# Patient Record
Sex: Female | Born: 1985
Health system: Southern US, Community
[De-identification: ages and names within clinical notes are randomized; demographics above are authoritative.]

## PROBLEM LIST (undated history)

## (undated) DIAGNOSIS — R896 Abnormal cytological findings in specimens from other organs, systems and tissues: Secondary | ICD-10-CM

## (undated) DIAGNOSIS — Z8742 Personal history of other diseases of the female genital tract: Secondary | ICD-10-CM

## (undated) DIAGNOSIS — L0501 Pilonidal cyst with abscess: Secondary | ICD-10-CM

## (undated) DIAGNOSIS — L0591 Pilonidal cyst without abscess: Secondary | ICD-10-CM

## (undated) DIAGNOSIS — E119 Type 2 diabetes mellitus without complications: Secondary | ICD-10-CM

## (undated) DIAGNOSIS — IMO0001 Reserved for inherently not codable concepts without codable children: Secondary | ICD-10-CM

## (undated) DIAGNOSIS — E282 Polycystic ovarian syndrome: Secondary | ICD-10-CM

## (undated) DIAGNOSIS — K8020 Calculus of gallbladder without cholecystitis without obstruction: Secondary | ICD-10-CM

## (undated) HISTORY — DX: Personal history of other diseases of the female genital tract: Z87.42

## (undated) HISTORY — DX: Reserved for inherently not codable concepts without codable children: IMO0001

## (undated) HISTORY — DX: Pilonidal cyst with abscess: L05.01

## (undated) HISTORY — DX: Calculus of gallbladder without cholecystitis without obstruction: K80.20

## (undated) HISTORY — DX: Abnormal cytological findings in specimens from other organs, systems and tissues: R89.6

## (undated) HISTORY — DX: Pilonidal cyst without abscess: L05.91

---
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
## 2006-03-31 ENCOUNTER — Other Ambulatory Visit: Admission: RE | Admit: 2006-03-31 | Discharge: 2006-03-31 | Payer: Self-pay | Admitting: Internal Medicine

## 2006-03-31 ENCOUNTER — Encounter: Payer: Self-pay | Admitting: Internal Medicine

## 2006-03-31 ENCOUNTER — Ambulatory Visit: Payer: Self-pay | Admitting: Internal Medicine

## 2006-03-31 ENCOUNTER — Encounter (INDEPENDENT_AMBULATORY_CARE_PROVIDER_SITE_OTHER): Payer: Self-pay | Admitting: Specialist

## 2006-03-31 LAB — CONVERTED CEMR LAB
ALT: 22 units/L (ref 0–40)
AST: 22 units/L (ref 0–37)
Albumin: 3.5 g/dL (ref 3.5–5.2)
Alkaline Phosphatase: 85 units/L (ref 39–117)
BUN: 9 mg/dL (ref 6–23)
Basophils Absolute: 0 10*3/uL (ref 0.0–0.1)
Bilirubin, Direct: 0.1 mg/dL (ref 0.0–0.3)
Calcium: 9.1 mg/dL (ref 8.4–10.5)
Chloride: 105 meq/L (ref 96–112)
Eosinophils Absolute: 0.5 10*3/uL (ref 0.0–0.6)
Eosinophils Relative: 5.5 % — ABNORMAL HIGH (ref 0.0–5.0)
GFR calc Af Amer: 118 mL/min
GFR calc non Af Amer: 97 mL/min
HDL: 57.3 mg/dL (ref >39.0)
Lymphocytes Relative: 41 % (ref 12.0–46.0)
MCHC: 33.7 g/dL (ref 30.0–36.0)
MCV: 90.5 fL (ref 78.0–100.0)
Monocytes Relative: 7.6 % (ref 3.0–11.0)
Neutro Abs: 4.1 10*3/uL (ref 1.4–7.7)
Platelets: 404 10*3/uL — ABNORMAL HIGH (ref 150–400)
RBC: 4.96 M/uL (ref 3.87–5.11)
TSH: 1.59 microintl units/mL (ref 0.35–5.50)
Total CHOL/HDL Ratio: 3.5
Triglycerides: 38 mg/dL (ref 0–149)
WBC: 8.9 10*3/uL (ref 4.5–10.5)

## 2006-05-12 ENCOUNTER — Ambulatory Visit: Payer: Self-pay | Admitting: Internal Medicine

## 2006-06-05 ENCOUNTER — Encounter: Payer: Self-pay | Admitting: Internal Medicine

## 2006-06-05 ENCOUNTER — Ambulatory Visit: Payer: Self-pay | Admitting: Internal Medicine

## 2006-06-05 DIAGNOSIS — J45909 Unspecified asthma, uncomplicated: Secondary | ICD-10-CM | POA: Insufficient documentation

## 2006-06-05 DIAGNOSIS — N926 Irregular menstruation, unspecified: Secondary | ICD-10-CM | POA: Insufficient documentation

## 2006-08-10 ENCOUNTER — Ambulatory Visit: Payer: Self-pay | Admitting: Internal Medicine

## 2006-08-10 ENCOUNTER — Telehealth: Payer: Self-pay | Admitting: Internal Medicine

## 2006-08-10 DIAGNOSIS — R079 Chest pain, unspecified: Secondary | ICD-10-CM | POA: Insufficient documentation

## 2006-10-05 ENCOUNTER — Emergency Department (HOSPITAL_COMMUNITY): Admission: EM | Admit: 2006-10-05 | Discharge: 2006-10-05 | Payer: Self-pay | Admitting: Emergency Medicine

## 2007-02-23 ENCOUNTER — Telehealth (INDEPENDENT_AMBULATORY_CARE_PROVIDER_SITE_OTHER): Payer: Self-pay | Admitting: *Deleted

## 2007-04-10 ENCOUNTER — Encounter: Payer: Self-pay | Admitting: Internal Medicine

## 2007-04-10 ENCOUNTER — Other Ambulatory Visit: Admission: RE | Admit: 2007-04-10 | Discharge: 2007-04-10 | Payer: Self-pay | Admitting: Internal Medicine

## 2007-04-10 ENCOUNTER — Ambulatory Visit: Payer: Self-pay | Admitting: Internal Medicine

## 2007-04-10 DIAGNOSIS — L639 Alopecia areata, unspecified: Secondary | ICD-10-CM

## 2007-04-10 DIAGNOSIS — G4700 Insomnia, unspecified: Secondary | ICD-10-CM | POA: Insufficient documentation

## 2007-04-10 LAB — CONVERTED CEMR LAB
ALT: 17 units/L (ref 0–35)
AST: 20 units/L (ref 0–37)
Albumin: 3.9 g/dL (ref 3.5–5.2)
BUN: 6 mg/dL (ref 6–23)
Basophils Relative: 1 % (ref 0.0–1.0)
Blood in Urine, dipstick: NEGATIVE
CO2: 31 meq/L (ref 19–32)
Calcium: 9.5 mg/dL (ref 8.4–10.5)
Chloride: 106 meq/L (ref 96–112)
Cholesterol: 184 mg/dL (ref 0–200)
Creatinine, Ser: 0.9 mg/dL (ref 0.4–1.2)
Eosinophils Relative: 4.4 % (ref 0.0–5.0)
Glucose, Bld: 96 mg/dL (ref 70–99)
Glucose, Urine, Semiquant: NEGATIVE
Hemoglobin: 14.5 g/dL (ref 12.0–15.0)
Ketones, urine, test strip: NEGATIVE
LDL Cholesterol: 117 mg/dL — ABNORMAL HIGH (ref 0–99)
Lymphocytes Relative: 37.3 % (ref 12.0–46.0)
Neutro Abs: 4.4 10*3/uL (ref 1.4–7.7)
Neutrophils Relative %: 50.6 % (ref 43.0–77.0)
Nitrite: NEGATIVE
Prolactin: 5.5 ng/mL
RBC: 4.74 M/uL (ref 3.87–5.11)
Specific Gravity, Urine: 1.015
TSH: 0.48 microintl units/mL (ref 0.35–5.50)
Total Protein: 6.7 g/dL (ref 6.0–8.3)
VLDL: 8 mg/dL (ref 0–40)
WBC: 8.8 10*3/uL (ref 4.5–10.5)
pH: 7

## 2007-04-12 ENCOUNTER — Telehealth: Payer: Self-pay | Admitting: Internal Medicine

## 2007-04-16 ENCOUNTER — Encounter: Payer: Self-pay | Admitting: Internal Medicine

## 2007-04-16 LAB — CONVERTED CEMR LAB: Vit D, 1,25-Dihydroxy: 9 — ABNORMAL LOW (ref 30–89)

## 2007-05-18 ENCOUNTER — Telehealth: Payer: Self-pay | Admitting: Internal Medicine

## 2007-06-28 ENCOUNTER — Ambulatory Visit: Payer: Self-pay | Admitting: Internal Medicine

## 2007-09-10 ENCOUNTER — Telehealth: Payer: Self-pay | Admitting: Internal Medicine

## 2008-04-29 ENCOUNTER — Telehealth: Payer: Self-pay | Admitting: Internal Medicine

## 2009-01-20 ENCOUNTER — Telehealth: Payer: Self-pay | Admitting: *Deleted

## 2009-01-24 ENCOUNTER — Emergency Department (HOSPITAL_COMMUNITY): Admission: EM | Admit: 2009-01-24 | Discharge: 2009-01-24 | Payer: Self-pay | Admitting: Family Medicine

## 2009-02-21 ENCOUNTER — Emergency Department (HOSPITAL_COMMUNITY): Admission: EM | Admit: 2009-02-21 | Discharge: 2009-02-22 | Payer: Self-pay | Admitting: Emergency Medicine

## 2009-09-07 ENCOUNTER — Ambulatory Visit: Payer: Self-pay | Admitting: Internal Medicine

## 2009-09-07 DIAGNOSIS — F4321 Adjustment disorder with depressed mood: Secondary | ICD-10-CM

## 2009-09-10 ENCOUNTER — Telehealth: Payer: Self-pay | Admitting: Internal Medicine

## 2009-09-28 ENCOUNTER — Ambulatory Visit: Payer: Self-pay | Admitting: Internal Medicine

## 2009-10-28 ENCOUNTER — Telehealth: Payer: Self-pay | Admitting: Internal Medicine

## 2009-10-28 ENCOUNTER — Ambulatory Visit: Payer: Self-pay | Admitting: Internal Medicine

## 2009-10-28 DIAGNOSIS — H6690 Otitis media, unspecified, unspecified ear: Secondary | ICD-10-CM | POA: Insufficient documentation

## 2009-10-28 DIAGNOSIS — J069 Acute upper respiratory infection, unspecified: Secondary | ICD-10-CM | POA: Insufficient documentation

## 2009-10-28 DIAGNOSIS — J45901 Unspecified asthma with (acute) exacerbation: Secondary | ICD-10-CM | POA: Insufficient documentation

## 2009-10-30 ENCOUNTER — Encounter: Payer: Self-pay | Admitting: Internal Medicine

## 2009-10-30 ENCOUNTER — Telehealth: Payer: Self-pay | Admitting: Internal Medicine

## 2009-12-17 ENCOUNTER — Emergency Department (HOSPITAL_COMMUNITY)
Admission: EM | Admit: 2009-12-17 | Discharge: 2009-12-17 | Payer: Self-pay | Source: Home / Self Care | Admitting: Family Medicine

## 2010-01-17 HISTORY — PX: CHOLECYSTECTOMY: SHX55

## 2010-02-16 NOTE — Assessment & Plan Note (Signed)
Summary: acute insomnia/dm   Vital Signs:  Patient profile:   25 year old female Menstrual status:  irregular LMP:     07/25/2009 Height:      64.25 inches Weight:      193 pounds BMI:     32.99 Pulse rate:   88 / minute BP sitting:   110 / 60  (right arm) Cuff size:   regular  Vitals Entered By: Romualdo Bolk, CMA (AAMA) (September 07, 2009 10:30 AM) CC: Discuss insomnia- Pt states that she doesn't sleep at night. Pt has been up since Noon on 8/21. Pt hasn't really slept in 2 months and on avg. gets 4-5 hours of sleep. Pt states that her boyfriend died in 16-Jul-2022 and she hasn't slept since then. LMP (date): 07/25/2009     Menstrual Status irregular Enter LMP: 07/25/2009 Last PAP Result Done   History of Present Illness: Jillian Copeland comes in today  for  above acute problem.    previous 6-8 hours of sleep without problem. ever since BF was murdered 2 months ago she hasnt been able to sleep much . denies hoeless ness or panic.or feelng unsafe.  ? what to do . tried fathers pill and no help .  increased etoh a bit to see if would help. .    Talking to counselor since his death.   x 2 .    Denies depression otherwise .  Asthma allergy /quiescent but needs refill of meds    flonase works well.     Last ov was 2009 here and no major change in health status per patient    Preventive Screening-Counseling & Management  Alcohol-Tobacco     Alcohol drinks/day: 1     Alcohol type: all     Smoking Status: current     Packs/Day: <0.25     Passive Smoke Exposure: no  Caffeine-Diet-Exercise     Caffeine use/day: 5+     Does Patient Exercise: no  Current Medications (verified): 1)  Flonase 50 Mcg/act Susp (Fluticasone Propionate) .... Spray 2 Spray Into Both Nostrils Once  A Day 2)  Proair Hfa 108 (90 Base) Mcg/act Aers (Albuterol Sulfate) .... Inhale 2 Puff As Directed Four Times A Day 3)  Qvar 80 Mcg/act Aers (Beclomethasone Dipropionate) .... Inhale 2 Puff As Directed Twice  A Day 4)  Yasmin 28 3-0.03 Mg Tabs (Drospirenone-Ethinyl Estradiol) .... Take 1 Tablet By Mouth Once A Day  Allergies (verified): No Known Drug Allergies  Past History:  Past medical, surgical, family and social histories (including risk factors) reviewed, and no changes noted (except as noted below).  Past Medical History: Reviewed history from 06/28/2007 and no changes required. Asthma 4# 7 oz BW Hx abnormal PAP ascus with neg HPV hx oligo ammenorrhea   Hx of eval in er for chest pain July    Past History:  Care Management: Counselor: Dondra Spry Chestnutt- Nestor Ramp   Family History: Reviewed history from 04/10/2007 and no changes required. mgm dm neg thyroid   Social History: Reviewed history from 06/28/2007 and no changes required.  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  going  to [employer]. fall 2011  Smoking Status:  current Packs/Day:  <0.25 Caffeine use/day:  5+ Does Patient Exercise:  no  Review of Systems       neg cv pulmonary   Gi GU.   Physical Exam  General:  Well-developed,well-nourished,in no acute distress; alert,appropriate and cooperative throughout examination  here with  24 year old family member  Head:  normocephalic and atraumatic.   Psych:  Oriented X3, memory intact for recent and remote, good eye contact, not anxious appearing, and not depressed appearing.     Impression & Recommendations:  Problem # 1:  INSOMNIA (ICD-780.52)  acute since BF murdered  no panic attacks   to decrease caffiene and avoid  alcohol.   problematic as to start school tomorrow Her updated medication list for this problem includes:    Lunesta 3 Mg Tabs (Eszopiclone) .Marland Kitchen... 1 by mouth at night if needed  for sleep  Discussed sleep hygiene.  HO x 1  and to monitor  .  Problem # 2:  GRIEF REACTION (ICD-309.0) seems rather nl  in context . counseled about htis sne expectations   Problem # 3:  ASTHMA (ICD-493.90) quiiescent and reviewed  use  of meds and will refill for now  Her updated medication list for this problem includes:    Proair Hfa 108 (90 Base) Mcg/act Aers (Albuterol sulfate) ..... Inhale 2 puff as directed four times a day    Qvar 80 Mcg/act Aers (Beclomethasone dipropionate) ..... Inhale 2 puff as directed twice a day  Complete Medication List: 1)  Flonase 50 Mcg/act Susp (Fluticasone propionate) .... Spray 2 spray into both nostrils once  a day 2)  Proair Hfa 108 (90 Base) Mcg/act Aers (Albuterol sulfate) .... Inhale 2 puff as directed four times a day 3)  Qvar 80 Mcg/act Aers (Beclomethasone dipropionate) .... Inhale 2 puff as directed twice a day 4)  Yasmin 28 3-0.03 Mg Tabs (Drospirenone-ethinyl estradiol) .... Take 1 tablet by mouth once a day 5)  Lunesta 3 Mg Tabs (Eszopiclone) .Marland Kitchen.. 1 by mouth at night if needed  for sleep  Patient Instructions: 1)  decrease caffiene     none after 6 hours pre sleep.  2)  Avoid alcohol as this can affect sleep in a negative way and cause despression.  3)  try lunesta  3 mg  samples   for 8 hours.    4)  Avoid back lighting  before bedtime . 5)  Continue with your counselor  for now   6)  rov in  3-4 weeks or so.  or as needed. Prescriptions: QVAR 80 MCG/ACT AERS (BECLOMETHASONE DIPROPIONATE) Inhale 2 puff as directed twice a day  #1 x 3   Entered and Authorized by:   Madelin Headings MD   Signed by:   Madelin Headings MD on 09/07/2009   Method used:   Electronically to        [facility] (retail)       [address]       [place], [place]  [id number]       Ph: [phone]       Fax: [phone]   RxID:   [id number] PROAIR HFA 108 (90 BASE) MCG/ACT AERS (ALBUTEROL SULFATE) Inhale 2 puff as directed four times a day  #1 x 2   Entered and Authorized by:   Madelin Headings MD   Signed by:   Madelin Headings MD on 09/07/2009   Method used:   Electronically to        [facility] (retail)       [address]        [place], [place]  [id number]       Ph: [phone]       Fax: [phone]  RxID:   [id number] FLONASE 50 MCG/ACT SUSP (FLUTICASONE PROPIONATE) Spray 2 spray into both nostrils once  a day  #1 x 12   Entered and Authorized by:   Madelin Headings MD   Signed by:   Madelin Headings MD on 09/07/2009   Method used:   Electronically to        [facility] (retail)       [address]       [place], [place]  [id number]       Ph: [phone]       Fax: [phone]   RxID:   [id number]

## 2010-02-16 NOTE — Letter (Signed)
Summary: Out of Work  Adult nurse at [employer]  [address]   [place], [place] [address]   Phone: [phone]  Fax: [phone]    October 30, 2009   Employee:  TATYANNA CRON[employer] [facility]    To Whom It May Concern:   For Medical reasons, please excuse the above named employee from work for the following dates:  Start:   10-30-09  End:    10-30-09.  This is in addition to the previous out of work note.    If you need additional information, please feel free to contact our office.         Sincerely,    Dr. Berniece Andreas

## 2010-02-16 NOTE — Progress Notes (Signed)
Summary: leakage continues & pain - work or not today 3pm?  Phone Note Call from Patient Call back at [phone]   Summary of Call: Still having clear leakage from ear.  No fever.  Ear is painful.  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   Initial call taken by: Rudy Jew, RN,  October 30, 2009 9:41 AM  Follow-up for Phone Call        no work today   if getting worse can be seen in sat clinic  Follow-up by: Madelin Headings MD,  October 30, 2009 12:47 PM  Additional Follow-up for Phone Call Additional follow up Details #1::        Per patient Fax note for today out of work to [phone] Attn Jesus.  Done.  Additional Follow-up by: Rudy Jew, RN,  October 30, 2009 1:12 PM

## 2010-02-16 NOTE — Assessment & Plan Note (Signed)
Summary: 3 WK ROV/NJR   Vital Signs:  Patient profile:   25 year old female Menstrual status:  irregular Weight:      192 pounds Pulse rate:   60 / minute BP sitting:   100 / 70  (left arm) Cuff size:   regular  Vitals Entered By: Romualdo Bolk, CMA (AAMA) (September 28, 2009 8:22 AM) CC: Follow-up visit on ambien. Pt states that it works pretty good.    History of Present Illness: Jillian Copeland comes in today  for follow up of  sleep meds.  taking  generic around 10 or 11  or so.   wakens up 4-5 am.      had bad tase in mouth on the [employer]    doing ok so far.   no panic    . try ing to "stay away form depression" . No new signs and asthma stable.     Preventive Screening-Counseling & Management  Alcohol-Tobacco     Alcohol drinks/day: 1     Alcohol type: all     Smoking Status: current     Packs/Day: <0.25     Passive Smoke Exposure: no  Caffeine-Diet-Exercise     Caffeine use/day: 5+     Does Patient Exercise: no  Current Medications (verified): 1)  Flonase 50 Mcg/act Susp (Fluticasone Propionate) .... Spray 2 Spray Into Both Nostrils Once  A Day 2)  Proair Hfa 108 (90 Base) Mcg/act Aers (Albuterol Sulfate) .... Inhale 2 Puff As Directed Four Times A Day 3)  Qvar 80 Mcg/act Aers (Beclomethasone Dipropionate) .... Inhale 2 Puff As Directed Twice A Day 4)  Yasmin 28 3-0.03 Mg Tabs (Drospirenone-Ethinyl Estradiol) .... Take 1 Tablet By Mouth Once A Day 5)  Ambien 10 Mg Tabs (Zolpidem Tartrate) .Marland Kitchen.. 1 By Mouth Hs As Needed For Sleep  Allergies (verified): No Known Drug Allergies  Past History:  Past medical, surgical, family and social histories (including risk factors) reviewed, and no changes noted (except as noted below).  Past Medical History: Reviewed history from 06/28/2007 and no changes required. Asthma 4# 7 oz BW Hx abnormal PAP ascus with neg HPV hx oligo ammenorrhea   Hx of eval in er for chest pain July    Past History:  Care  Management: Counselor: Dondra Spry Chestnutt- Nestor Ramp   Family History: Reviewed history from 04/10/2007 and no changes required. mgm dm neg thyroid   Social History: Reviewed history from 09/07/2009 and no changes required.  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  going  to [employer]. fall 2011  Review of Systems       no change   Physical Exam  General:  alert, well-developed, well-nourished, and well-hydrated.   Psych:  Oriented X3, normally interactive, good eye contact, not anxious appearing, and not depressed appearing.  less stressed and more relaxed appearing   Impression & Recommendations:  Problem # 1:  INSOMNIA (ICD-780.52) helped by sleep aid.   Discussed risk benefit  again.  using med 2-3 x per week  .  pt aware.  The following medications were removed from the medication list:    Lunesta 3 Mg Tabs (Eszopiclone) .Marland Kitchen... 1 by mouth at night if needed  for sleep Her updated medication list for this problem includes:    Ambien 10 Mg Tabs (Zolpidem tartrate) .Marland Kitchen... 1 by mouth hs as needed for sleep  Problem # 2:  GRIEF REACTION (ICD-309.0) coping   Problem #  3:  ASTHMA (ICD-493.90) stable flu shot today Her updated medication list for this problem includes:    Proair Hfa 108 (90 Base) Mcg/act Aers (Albuterol sulfate) ..... Inhale 2 puff as directed four times a day    Qvar 80 Mcg/act Aers (Beclomethasone dipropionate) ..... Inhale 2 puff as directed twice a day  Complete Medication List: 1)  Flonase 50 Mcg/act Susp (Fluticasone propionate) .... Spray 2 spray into both nostrils once  a day 2)  Proair Hfa 108 (90 Base) Mcg/act Aers (Albuterol sulfate) .... Inhale 2 puff as directed four times a day 3)  Qvar 80 Mcg/act Aers (Beclomethasone dipropionate) .... Inhale 2 puff as directed twice a day 4)  Yasmin 28 3-0.03 Mg Tabs (Drospirenone-ethinyl estradiol) .... Take 1 tablet by mouth once a day 5)  Ambien 10 Mg Tabs (Zolpidem tartrate) .Marland Kitchen.. 1 by  mouth hs as needed for sleep  Other Orders: Admin 1st Vaccine (40347) Flu Vaccine 74yrs + (42595)  Patient Instructions: 1)  continue sleep hygiene 2)  med as needed. 3)  return office visit  months or as needed. Prescriptions: AMBIEN 10 MG TABS (ZOLPIDEM TARTRATE) 1 by mouth hs as needed for sleep  #30 x 0   Entered and Authorized by:   Madelin Headings MD   Signed by:   Madelin Headings MD on 09/28/2009   Method used:   Print then Give to Patient   RxID:   [id number]        Flu Vaccine Consent Questions     Do you have a history of severe allergic reactions to this vaccine? no    Any prior history of allergic reactions to egg and/or gelatin? no    Do you have a sensitivity to the preservative Thimersol? no    Do you have a past history of Guillan-Barre Syndrome? no    Do you currently have an acute febrile illness? no    Have you ever had a severe reaction to latex? no    Vaccine information given and explained to patient? yes    Are you currently pregnant? no    Lot Number:[id number]   Exp Date:07/17/2010   Site Given  Left Deltoid IMu Romualdo Bolk, CMA (AAMA)  September 28, 2009 8:25 AM

## 2010-02-16 NOTE — Progress Notes (Signed)
Summary: please return call  Phone Note Call from Patient Call back at [phone]   Caller: Patient-live call Reason for Call: Talk to Nurse Summary of Call: Would like for [facility] to return call. No further message was given. Initial call taken by: Warnell Forester,  January 20, 2009 12:55 PM  Follow-up for Phone Call        LM for pt call back. Follow-up by: Romualdo Bolk, CMA Duncan Dull),  January 20, 2009 5:11 PM  Additional Follow-up for Phone Call Additional follow up Details #1::        LMTOCB Additional Follow-up by: Romualdo Bolk, CMA Duncan Dull),  January 23, 2009 11:32 AM    Additional Follow-up for Phone Call Additional follow up Details #2::    LMTOCB Romualdo Bolk, CMA (AAMA)  January 28, 2009 10:07 AM Pt never returned call. Follow-up by: Romualdo Bolk, CMA (AAMA),  February 02, 2009 8:55 AM

## 2010-02-16 NOTE — Assessment & Plan Note (Signed)
Summary: coughing and sob/ssc   Vital Signs:  Patient profile:   25 year old female Menstrual status:  irregular LMP:     10/26/2009 Weight:      191 pounds O2 Sat:      98 % on Room air Temp:     98.4 degrees F oral Pulse rate:   85 / minute BP sitting:   110 / 80  (right arm) Cuff size:   regular  Vitals Entered By: Romualdo Bolk, CMA (AAMA) (October 28, 2009 4:05 PM)  O2 Flow:  Room air CC: Coughing, congestion, sob and wheezing. No fever this started on 10/10. Rt ear runny with clear fluid. LMP (date): 10/26/2009     Enter LMP: 10/26/2009 Last PAP Result Done   History of Present Illness: Jillian Copeland  comes in today as an acute  visit for above  Onsetr with 2 days of sore throat and then cough.  and now wheezing.    today used Inhaler today x 2  ? no help . using and otc.   ? name  some sneezing.   now ear pain x one day better after clear drainaing  no drops in ear .  some face pressure    chest feels tight. ? no fever but didnt check.  small premire infant at home  avoiding   Preventive Screening-Counseling & Management  Alcohol-Tobacco     Alcohol drinks/day: 1     Alcohol type: all     Smoking Status: current     Packs/Day: <0.25     Passive Smoke Exposure: no  Caffeine-Diet-Exercise     Caffeine use/day: 5+     Does Patient Exercise: no  Current Medications (verified): 1)  Flonase 50 Mcg/act Susp (Fluticasone Propionate) .... Spray 2 Spray Into Both Nostrils Once  A Day 2)  Proair Hfa 108 (90 Base) Mcg/act Aers (Albuterol Sulfate) .... Inhale 2 Puff As Directed Four Times A Day 3)  Qvar 80 Mcg/act Aers (Beclomethasone Dipropionate) .... Inhale 2 Puff As Directed Twice A Day 4)  Yasmin 28 3-0.03 Mg Tabs (Drospirenone-Ethinyl Estradiol) .... Take 1 Tablet By Mouth Once A Day 5)  Ambien 10 Mg Tabs (Zolpidem Tartrate) .Marland Kitchen.. 1 By Mouth Hs As Needed For Sleep  Allergies (verified): No Known Drug Allergies  Past History:  Past medical, surgical,  family and social histories (including risk factors) reviewed for relevance to current acute and chronic problems.  Past Medical History: Reviewed history from 06/28/2007 and no changes required. Asthma 4# 7 oz BW Hx abnormal PAP ascus with neg HPV hx oligo ammenorrhea   Hx of eval in er for chest pain July    Past History:  Care Management: Counselor: Dondra Spry Chestnutt- Nestor Ramp   Family History: Reviewed history from 04/10/2007 and no changes required. mgm dm neg thyroid   Social History: Reviewed history from 09/07/2009 and no changes required. 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 fall 2011  Review of Systems  The patient denies anorexia, fever, weight loss, vision loss, chest pain, syncope, peripheral edema, hemoptysis, abdominal pain, severe indigestion/heartburn, hematuria, transient blindness, difficulty walking, abnormal bleeding, enlarged lymph nodes, and angioedema.    Physical Exam  General:  alert, well-developed, and well-nourished.  congested in nad  not breathless  good color  Head:  normocephalic and atraumatic.   Eyes:  vision grossly intact, pupils equal, and pupils round.  Ears:  L ear normal.  right ear red and  dusky light reflex ron right  mild eac tenderness no discharge seen  and no perf seen   Nose:  no external deformity and no external erythema.  congested right more than left  Mouth:  pharynx pink and moist.   no lesions Neck:  No deformities, masses, or tenderness noted. Lungs:  some decrease air movement   no rales normal respiratory effort, no intercostal retractions, no accessory muscle use, and no wheezes.   Heart:  normal rate and regular rhythm.     Impression & Recommendations:  Problem # 1:  OTITIS MEDIA, ACUTE (ICD-382.9) right    complicated  uri .could be self limiting but with asthma will empirically rx with antibiotic  and cover for sinustis dosing  Her updated  medication list for this problem includes:    Amoxicillin 500 Mg Tabs (Amoxicillin) .Marland Kitchen... 1 by mouth three times a day for 7 days  Problem # 2:  URI (ICD-465.9) prob viral  some localization  Problem # 3:  ASTHMA, WITH ACUTE EXACERBATION (ICD-493.92)  mild flare     from above   at risk. Her updated medication list for this problem includes:    Proair Hfa 108 (90 Base) Mcg/act Aers (Albuterol sulfate) ..... Inhale 2 puff as directed four times a day    Qvar 80 Mcg/act Aers (Beclomethasone dipropionate) ..... Inhale 2 puff as directed twice a day  Pulmonary Functions Reviewed: O2 sat: 98 (10/28/2009)  Orders: Nebulizer Tx ([address])  Complete Medication List: 1)  Flonase 50 Mcg/act Susp (Fluticasone propionate) .... Spray 2 spray into both nostrils once  a day 2)  Proair Hfa 108 (90 Base) Mcg/act Aers (Albuterol sulfate) .... Inhale 2 puff as directed four times a day 3)  Qvar 80 Mcg/act Aers (Beclomethasone dipropionate) .... Inhale 2 puff as directed twice a day 4)  Yasmin 28 3-0.03 Mg Tabs (Drospirenone-ethinyl estradiol) .... Take 1 tablet by mouth once a day 5)  Ambien 10 Mg Tabs (Zolpidem tartrate) .Marland Kitchen.. 1 by mouth hs as needed for sleep 6)  Amoxicillin 500 Mg Tabs (Amoxicillin) .Marland Kitchen.. 1 by mouth three times a day for 7 days  Patient Instructions: 1)  take antibiotic for ear infection and possible sinus infection 2)  restart the q var to 2 puffs two times a day  because of the wheezing 3)  Use the pro air up to every 6 hours as needed. 4)  if not improving before the weekend then call or if worse . 5)  consider proenisone if wheezing not controllled. 6)  congestion may last a week or so otherwise.  Prescriptions: AMOXICILLIN 500 MG TABS (AMOXICILLIN) 1 by mouth three times a day for 7 days  #21 x 0   Entered and Authorized by:   Madelin Headings MD   Signed by:   Madelin Headings MD on 10/28/2009   Method used:   Electronically to        [facility] (retail)        [address]       [place], [place]  [id number]       Ph: [phone]       Fax: [phone]   RxID:   [id number]

## 2010-02-16 NOTE — Progress Notes (Signed)
Summary: ov today-please triage  Phone Note Call from Patient Call back at [phone]   Caller: Patient Call For: Madelin Headings MD Summary of Call: pt has cough requesting ov today Initial call taken by: Heron Sabins,  October 28, 2009 11:35 AM  Follow-up for Phone Call        Spoke to pt and she states that she has a cough, clear liquid is coming out of rt ear, Pt has wheezing and sob. Per Dr. Fabian Sharp- okay to work in today. Pt is coming in now. Follow-up by: Romualdo Bolk, CMA Duncan Dull),  October 28, 2009 2:57 PM

## 2010-02-16 NOTE — Progress Notes (Signed)
Summary: questions about meds  Phone Note Call from Patient   Caller: Patient Call For: Madelin Headings MD Summary of Call: Pt has questions about sleeping meds. [phone] Initial call taken by: Lynann Beaver CMA,  September 10, 2009 1:57 PM  Follow-up for Phone Call        [facility]. Lynann Beaver [facility]  September 10, 2009 4:42 PM  Follow-up by: Lynann Beaver CMA,  September 10, 2009 2:27 PM  Additional Follow-up for Phone Call Additional follow up Details #1::        Spoke to pt and lunesta are leaving a after taste in her mouth.  Additional Follow-up by: Romualdo Bolk, CMA (AAMA),  September 11, 2009 1:07 PM    New/Updated Medications: AMBIEN 10 MG TABS (ZOLPIDEM TARTRATE) 1 by mouth hs as needed for sleep Prescriptions: AMBIEN 10 MG TABS (ZOLPIDEM TARTRATE) 1 by mouth hs as needed for sleep  #14 x 0   Entered and Authorized by:   Madelin Headings MD   Signed by:   Madelin Headings MD on 09/11/2009   Method used:   Print then Give to Patient   RxID:   [id number]  disc with patient and mom  in person when in for  sisters appt today.     She got a few hours of sleep   but had a bad taste in mouth .   ? anythinkg else to try.    rx to try ambien but may not be any more effective .  call if not helping  .   for situation.  keep follow up appt.

## 2010-03-30 ENCOUNTER — Other Ambulatory Visit: Payer: Self-pay | Admitting: Internal Medicine

## 2010-03-31 NOTE — Telephone Encounter (Signed)
Last refill was in the fall.  Ok to refill x 1

## 2010-04-01 NOTE — Telephone Encounter (Signed)
Called in rx

## 2010-05-05 ENCOUNTER — Encounter: Payer: Self-pay | Admitting: Internal Medicine

## 2010-05-06 ENCOUNTER — Ambulatory Visit: Payer: Self-pay | Admitting: Internal Medicine

## 2010-10-07 ENCOUNTER — Ambulatory Visit: Payer: BC Managed Care – PPO | Admitting: Internal Medicine

## 2010-10-07 ENCOUNTER — Emergency Department (HOSPITAL_COMMUNITY)
Admission: EM | Admit: 2010-10-07 | Discharge: 2010-10-07 | Disposition: A | Discharge status: Home / Self Care | Payer: Managed Care, Other (non HMO) | Attending: Emergency Medicine | Admitting: Emergency Medicine

## 2010-10-07 ENCOUNTER — Emergency Department (HOSPITAL_COMMUNITY): Payer: Managed Care, Other (non HMO)

## 2010-10-07 ENCOUNTER — Telehealth: Payer: Self-pay | Admitting: *Deleted

## 2010-10-07 DIAGNOSIS — K8020 Calculus of gallbladder without cholecystitis without obstruction: Secondary | ICD-10-CM | POA: Insufficient documentation

## 2010-10-07 DIAGNOSIS — R197 Diarrhea, unspecified: Secondary | ICD-10-CM | POA: Insufficient documentation

## 2010-10-07 DIAGNOSIS — R109 Unspecified abdominal pain: Secondary | ICD-10-CM

## 2010-10-07 LAB — URINALYSIS, ROUTINE W REFLEX MICROSCOPIC
Glucose, UA: NEGATIVE mg/dL
Specific Gravity, Urine: 1.011 (ref 1.005–1.030)
Urobilinogen, UA: 1 mg/dL (ref 0.0–1.0)
pH: 8 (ref 5.0–8.0)

## 2010-10-07 LAB — DIFFERENTIAL
Basophils Absolute: 0 10*3/uL (ref 0.0–0.1)
Eosinophils Relative: 1 % (ref 0–5)
Lymphocytes Relative: 21 % (ref 12–46)
Monocytes Absolute: 1.2 10*3/uL — ABNORMAL HIGH (ref 0.1–1.0)

## 2010-10-07 LAB — URINE MICROSCOPIC-ADD ON

## 2010-10-07 LAB — CBC
HCT: 43.2 % (ref 36.0–46.0)
MCHC: 35.2 g/dL (ref 30.0–36.0)
MCV: 89.1 fL (ref 78.0–100.0)
RDW: 12.4 % (ref 11.5–15.5)

## 2010-10-07 LAB — COMPREHENSIVE METABOLIC PANEL
Albumin: 3.9 g/dL (ref 3.5–5.2)
BUN: 8 mg/dL (ref 6–23)
Creatinine, Ser: 0.87 mg/dL (ref 0.50–1.10)
Total Protein: 7.4 g/dL (ref 6.0–8.3)

## 2010-10-07 LAB — LIPASE, BLOOD: Lipase: 33 U/L (ref 11–59)

## 2010-10-07 NOTE — Telephone Encounter (Signed)
Pt was seen in the ED for stomach pains. Pt was dx with gallstones. They wanted to remove them last night but after taking percocet, pt felt fine. Pt would like a 2nd opinion for this. I advised pt instead of coming here, why don't we refer her to the surgeon and get their opinion on this because this is what we would do any way. Pt and dad okay with this. Order sent to [facility].

## 2010-10-11 ENCOUNTER — Encounter (INDEPENDENT_AMBULATORY_CARE_PROVIDER_SITE_OTHER): Payer: Self-pay | Admitting: General Surgery

## 2010-10-11 ENCOUNTER — Ambulatory Visit (INDEPENDENT_AMBULATORY_CARE_PROVIDER_SITE_OTHER): Payer: BC Managed Care – PPO | Admitting: General Surgery

## 2010-10-11 VITALS — BP 132/92 | HR 73 | Temp 97.2°F | Ht 63.5 in | Wt 195.6 lb

## 2010-10-11 DIAGNOSIS — K8020 Calculus of gallbladder without cholecystitis without obstruction: Secondary | ICD-10-CM

## 2010-10-11 HISTORY — DX: Calculus of gallbladder without cholecystitis without obstruction: K80.20

## 2010-10-11 NOTE — Patient Instructions (Signed)
Gallbladder literature given.

## 2010-10-11 NOTE — Progress Notes (Signed)
Chief Complaint  Patient presents with  . Other    eval gb with stones    HPI Jillian Copeland is a 25 y.o. female.   HPI This is a 25 year old female who has a history over the last month or 2 of having upper abdominal pain especially after she eats greasy food. This had been occurring intermittently and then she presented to the emergency room last Wednesday. This usually goes away on itself at this time it did not so she received some pain medication. She has no nausea or vomiting. She has some occasional loose stools. She has no fevers associated with this. She never had a prior history before this. He usually goes away except for the one episode required an emergency room visit. She underwent an ultrasound in the emergency room showing a small gallstone but otherwise was negative. Her liver function tests and lipase were all normal. She comes in today discuss options. She is referred by Dr. Fabian Copeland.  Past Medical History  Diagnosis Date  . Asthma   . Abnormal finding on Pap smear, ASCUS     with neg hpv  . Personal history of amenorrhea     oligo  . Chest pain     eval in er in July    History reviewed. No pertinent past surgical history.  History reviewed. No pertinent family history.  Social History History  Substance Use Topics  . Smoking status: Never Smoker   . Smokeless tobacco: Not on file  . Alcohol Use: Yes     social    No Known Allergies  Current Outpatient Prescriptions  Medication Sig Dispense Refill  . albuterol (PROAIR HFA) 108 (90 BASE) MCG/ACT inhaler Inhale 2 puffs into the lungs every 6 (six) hours as needed.        . beclomethasone (QVAR) 80 MCG/ACT inhaler Inhale 1 puff into the lungs as needed.        . drospirenone-ethinyl estradiol (YASMIN) 3-0.03 MG per tablet Take 1 tablet by mouth daily.        . fluticasone (FLONASE) 50 MCG/ACT nasal spray 2 sprays by Nasal route daily.        Marland Kitchen zolpidem (AMBIEN) 10 MG tablet take 1 tablet by mouth at  bedtime if needed for sleep  30 tablet  0    Review of Systems Review of Systems  Gastrointestinal: Positive for diarrhea.  All other systems reviewed and are negative.    Blood pressure 132/92, pulse 73, temperature 97.2 F (36.2 C), height 5' 3.5" (1.613 m), weight 195 lb 9.6 oz (88.724 kg).  Physical Exam Physical Exam  Constitutional: She appears well-developed and well-nourished.  Eyes: No scleral icterus.  Neck: Neck supple.  Cardiovascular: Normal rate, regular rhythm and normal heart sounds.   Pulmonary/Chest: Effort normal and breath sounds normal. She has no wheezes. She has no rales.  Abdominal: Soft. Bowel sounds are normal. She exhibits no distension and no mass. There is no tenderness. There is no rebound and no guarding.  Lymphadenopathy:    She has no cervical adenopathy.    Data Reviewed *RADIOLOGY REPORT*  Clinical Data: Abdominal and pelvic pain  COMPLETE ABDOMINAL ULTRASOUND  Comparison: None.  Findings:  Gallbladder: Small gallstone present. No gallbladder wall  thickening or pericholecystic fluid. Negative sonographic Murphy's  sign  Common bile duct: Measures up to 6 mm. The distal duct is  obscured by overlying bowel gas artifact.  Liver: Increased in echogenicity. No focal lesion identified.  IVC: Appears normal.  Pancreas: Inadequately visualized secondary to overlying bowel gas  artifact.  Spleen: Measures 5 cm oblique. No focal lesion.  Right Kidney: No hydronephrosis or focal lesion. Measures 9.2 cm.  Left Kidney: No hydronephrosis or focal lesion. Measures 10.8 cm.  Abdominal aorta: Not visualized proximally secondary to overlying  bowel gas artifact. Where seen, measures up to 1.6 cm  IMPRESSION:  Cholelithiasis without sonographic evidence for cholecystitis.   Assessment    Symptomatic cholelithiasis    Plan   I think that her symptoms are referable to her gallbladder although she does have a single small gallstone present. She is  pretty typical pain as well as a radiates to her back. It is associated with greasy foods. I discussed with her that I would recommend a laparoscopic cholecystectomy due to the fact that she is symptomatic as well as due to the fact that she may have further complications from the stones now she is beginning to have symptoms as well. They asked about observation and I did not recommend that although I did tell them it was their choice about whether to proceed with surgery. I discussed the risks of laparoscopic cholecystectomy to be but not limited to bleeding, infection, cystic duct leak, common bile duct injury and open procedure. They would like to think about this. I did give him some literature and they're going to call me back.        Jillian Copeland 10/11/2010, 4:57 PM

## 2010-10-14 NOTE — Consult Note (Signed)
NAMECHARO, PHILIPP NO.:  [id number]  MEDICAL RECORD NO.:  [id number]  LOCATION:  [facility]                         FACILITY:  [facility]  PHYSICIAN:  Ollen Gross. Vernell Morgans, M.D. DATE OF BIRTH:  05-31-1985  DATE OF CONSULTATION:  10/07/2010 DATE OF DISCHARGE:                                CONSULTATION   CONSULTING SURGEON:  Ollen Gross. Vernell Morgans, MD  REQUESTING PHYSICIAN:  Bethann Berkshire, MD in the Emergency Department.  PRIMARY CARE PHYSICIAN:  Neta Mends. Panosh, MD  REASON FOR CONSULTATION:  Abdominal pain with gallstones.  HISTORY OF PRESENT ILLNESS:  Jillian Copeland is a 25 year old healthy black female who developed epigastric abdominal pain last night after eating supper.  She states she had grilled chicken for supper.  She denies any nausea or vomiting but just epigastric pain.  This radiated to her back.  She says she has had no prior episodes similar to this. Due to continued pain, she was brought to the emergency department last night.  She had further workup with an ultrasound which revealed mobile, small gallstones but no evidence of cholecystitis or gallbladder wall thickening.  She did have an elevated white blood cell count of 15,000 and a mild transaminitis with an AST of 135 and a ALT of 74.  Her alkaline phosphatase, bilirubin, and lipase were all normal.  Because of these findings, we have been asked to evaluate the patient.  REVIEW OF SYSTEMS:  Please see HPI.  Otherwise all other systems have been reviewed and are negative.  FAMILY HISTORY:  Noncontributory.  PAST MEDICAL HISTORY:  Asthma.  PAST SURGICAL HISTORY:  None.  SOCIAL HISTORY:  The patient admits to smoking 10 cigarettes a day.  She is a social drinker.  Denies any illicit drug abuse.  She is currently in school for nursing and lives at home with her dad.  ALLERGIES:  NKDA.  MEDICATIONS:  None.  PHYSICAL EXAMINATION:  GENERAL:  Jillian Copeland is a pleasant 25 year old black female  who is well developed and well nourished and currently lying in bed in no acute distress. VITAL SIGNS:  Temperature 97.8, pulse 54, blood pressure 126/80, respirations 14. HEENT:  Head is normocephalic, atraumatic.  Sclerae noninjected.  Pupils equal, round, and reactive to light.  Ears and nose without any obvious masses or lesions.  No rhinorrhea.  Mouth is pink.  Throat shows no exudate. HEART:  Regular rate and rhythm.  Normal S1, S2.  No murmurs, gallops, or rubs are noted.  She does have palpable carotid, radial, and pedal pulses bilaterally. LUNGS:  Clear to auscultation bilaterally with no wheezes, rhonchi, or rales noted.  Respiratory effort was nonlabored. ABDOMEN:  Soft and nontender, nondistended with active bowel sounds. She does not have any peritonitis, masses, hernias, or Murphy sign. MUSCULOSKELETAL:  All 4 extremities are symmetrical.  No cyanosis, clubbing, or edema. SKIN:  Warm and dry without masses, lesions, or rashes. PSYCH:  The patient is alert and oriented x3 with appropriate affect.  LABS AND DIAGNOSTICS:  White blood cell count of 15,300, hemoglobin 15.2, hematocrit 43.2, platelet count is 360,000.  Sodium 140, potassium 4.1, glucose 107, BUN 8, creatinine 0.87, total bilirubin 0.5, alkaline phosphatase 95, AST 135,  ALT 74, lipase is 33.  Diagnostic ultrasound of the abdomen reveals cholelithiasis without evidence of sonographic cholecystitis.  IMPRESSION:  Biliary colic.  PLAN:  Currently the patient is pain free.  She was given 1 Percocet around 0420 a.m. this morning but is no longer having any symptoms.  I have discussed the findings with the patient as well as her father who is present.  Given the fact that the patient is now asymptomatic, I do believe that she can go home and follow up with [place] as an outpatient for an elective cholecystectomy.  We will have the emergency department give the patient clear liquids prior to discharge to make sure she  can tolerate these.  If she does tolerate these, she may be discharged home and she is instructed on a low-fat diet.  If for some reason, she is unable to tolerate clear liquids and her pain returned, I have discussed with the emergency room physician assistant to call me back and we would look at an admission.     Jillian Cape, PA   ______________________________ Ollen Gross. Vernell Morgans, M.D.    KEO/MEDQ  D:  10/07/2010  T:  10/07/2010  Job:  [id number]  cc:   [facility] Neta Mends. Fabian Sharp, MD Bethann Berkshire, MD  Electronically Signed by Barnetta Chapel PA on 10/11/2010 01:27:48 PM Electronically Signed by Chevis Pretty III M.D. on 10/14/2010 09:49:42 AM

## 2010-10-18 ENCOUNTER — Ambulatory Visit (INDEPENDENT_AMBULATORY_CARE_PROVIDER_SITE_OTHER): Payer: BC Managed Care – PPO | Admitting: General Surgery

## 2010-10-19 ENCOUNTER — Telehealth (INDEPENDENT_AMBULATORY_CARE_PROVIDER_SITE_OTHER): Payer: Self-pay

## 2010-10-19 NOTE — Telephone Encounter (Signed)
Returned pt's message from this am b/c she was calling about having pain at the level of a 10. The pt does not have any nausea,vomiting, or fevers. The pt did take one Vicodin this am. The pt is scheduled for surgery next wk by Dr Dwain Sarna and I advised pt if she continues to get worse she will need to go to the ER. / [facility]

## 2010-10-22 ENCOUNTER — Other Ambulatory Visit (INDEPENDENT_AMBULATORY_CARE_PROVIDER_SITE_OTHER): Payer: Self-pay | Admitting: General Surgery

## 2010-10-22 ENCOUNTER — Emergency Department (HOSPITAL_COMMUNITY): Payer: Managed Care, Other (non HMO)

## 2010-10-22 ENCOUNTER — Encounter (HOSPITAL_COMMUNITY): Payer: Managed Care, Other (non HMO)

## 2010-10-22 ENCOUNTER — Ambulatory Visit (HOSPITAL_COMMUNITY)
Admission: RE | Admit: 2010-10-22 | Discharge: 2010-10-22 | Disposition: A | Discharge status: Home / Self Care | Payer: Managed Care, Other (non HMO) | Source: Ambulatory Visit | Attending: General Surgery | Admitting: General Surgery

## 2010-10-22 ENCOUNTER — Telehealth (INDEPENDENT_AMBULATORY_CARE_PROVIDER_SITE_OTHER): Payer: Self-pay

## 2010-10-22 ENCOUNTER — Observation Stay (HOSPITAL_COMMUNITY)
Admission: EM | Admit: 2010-10-22 | Discharge: 2010-10-24 | DRG: 446 | Disposition: A | Discharge status: Home / Self Care | Payer: Managed Care, Other (non HMO) | Attending: General Surgery | Admitting: General Surgery

## 2010-10-22 DIAGNOSIS — K8010 Calculus of gallbladder with chronic cholecystitis without obstruction: Principal | ICD-10-CM | POA: Diagnosis present

## 2010-10-22 DIAGNOSIS — Z23 Encounter for immunization: Secondary | ICD-10-CM | POA: Insufficient documentation

## 2010-10-22 DIAGNOSIS — Z01810 Encounter for preprocedural cardiovascular examination: Secondary | ICD-10-CM | POA: Insufficient documentation

## 2010-10-22 DIAGNOSIS — Z01812 Encounter for preprocedural laboratory examination: Secondary | ICD-10-CM | POA: Insufficient documentation

## 2010-10-22 DIAGNOSIS — Z01818 Encounter for other preprocedural examination: Secondary | ICD-10-CM | POA: Insufficient documentation

## 2010-10-22 DIAGNOSIS — K8020 Calculus of gallbladder without cholecystitis without obstruction: Secondary | ICD-10-CM

## 2010-10-22 DIAGNOSIS — F17200 Nicotine dependence, unspecified, uncomplicated: Secondary | ICD-10-CM | POA: Insufficient documentation

## 2010-10-22 DIAGNOSIS — J45909 Unspecified asthma, uncomplicated: Secondary | ICD-10-CM | POA: Insufficient documentation

## 2010-10-22 LAB — COMPREHENSIVE METABOLIC PANEL
ALT: 38 U/L — ABNORMAL HIGH (ref 0–35)
ALT: 39 U/L — ABNORMAL HIGH (ref 0–35)
Albumin: 3.5 g/dL (ref 3.5–5.2)
Albumin: 3.9 g/dL (ref 3.5–5.2)
Alkaline Phosphatase: 93 U/L (ref 39–117)
Alkaline Phosphatase: 94 U/L (ref 39–117)
BUN: 10 mg/dL (ref 6–23)
BUN: 11 mg/dL (ref 6–23)
Chloride: 105 mEq/L (ref 96–112)
Chloride: 103 mEq/L (ref 96–112)
Glucose, Bld: 105 mg/dL — ABNORMAL HIGH (ref 70–99)
Glucose, Bld: 88 mg/dL (ref 70–99)
Potassium: 3.4 mEq/L — ABNORMAL LOW (ref 3.5–5.1)
Potassium: 4 mEq/L (ref 3.5–5.1)
Sodium: 138 mEq/L (ref 135–145)
Total Bilirubin: 0.1 mg/dL — ABNORMAL LOW (ref 0.3–1.2)
Total Bilirubin: 0.2 mg/dL — ABNORMAL LOW (ref 0.3–1.2)
Total Protein: 7.6 g/dL (ref 6.0–8.3)

## 2010-10-22 LAB — CBC
HCT: 41.3 % (ref 36.0–46.0)
HCT: 42.9 % (ref 36.0–46.0)
Hemoglobin: 14.1 g/dL (ref 12.0–15.0)
MCV: 90.9 fL (ref 78.0–100.0)
Platelets: 379 10*3/uL (ref 150–400)
RBC: 4.72 MIL/uL (ref 3.87–5.11)
WBC: 8.2 10*3/uL (ref 4.0–10.5)
WBC: 10.3 10*3/uL (ref 4.0–10.5)

## 2010-10-22 LAB — DIFFERENTIAL
Basophils Absolute: 0.1 10*3/uL (ref 0.0–0.1)
Basophils Absolute: 0.1 10*3/uL (ref 0.0–0.1)
Eosinophils Relative: 5 % (ref 0–5)
Lymphocytes Relative: 47 % — ABNORMAL HIGH (ref 12–46)
Lymphocytes Relative: 50 % — ABNORMAL HIGH (ref 12–46)
Monocytes Absolute: 0.6 10*3/uL (ref 0.1–1.0)
Monocytes Absolute: 0.6 10*3/uL (ref 0.1–1.0)
Monocytes Relative: 7 % (ref 3–12)
Monocytes Relative: 6 % (ref 3–12)
Neutro Abs: 3.3 10*3/uL (ref 1.7–7.7)

## 2010-10-22 LAB — SURGICAL PCR SCREEN: MRSA, PCR: NEGATIVE

## 2010-10-22 LAB — LIPASE, BLOOD: Lipase: 26 U/L (ref 11–59)

## 2010-10-22 NOTE — Telephone Encounter (Signed)
Pt's father called today to report that his daughter was in a great deal of pain and probably could not make it until her scheduled surgery date of 10/27/10.  I advised him to take her to the [facility] ED, and if she needed emergency gallbladder surgery they would contact our surgeon on call.  Pt's father will call us on Monday to let us know how Derisha is doing.

## 2010-10-23 ENCOUNTER — Other Ambulatory Visit (INDEPENDENT_AMBULATORY_CARE_PROVIDER_SITE_OTHER): Payer: Self-pay | Admitting: General Surgery

## 2010-10-23 ENCOUNTER — Inpatient Hospital Stay (HOSPITAL_COMMUNITY): Payer: Managed Care, Other (non HMO)

## 2010-10-23 DIAGNOSIS — K8010 Calculus of gallbladder with chronic cholecystitis without obstruction: Secondary | ICD-10-CM

## 2010-10-27 ENCOUNTER — Ambulatory Visit (HOSPITAL_COMMUNITY): Admission: RE | Admit: 2010-10-27 | Payer: BC Managed Care – PPO | Source: Ambulatory Visit | Admitting: General Surgery

## 2010-10-29 NOTE — Op Note (Signed)
NAMEHADLEI, STITT NO.:  [id number]  MEDICAL RECORD NO.:  [id number]  LOCATION:  1514                         FACILITY:  [facility]  PHYSICIAN:  Lodema Pilot, MD       DATE OF BIRTH:  [birth year]  DATE OF PROCEDURE:  10/23/2010 DATE OF DISCHARGE:                              OPERATIVE REPORT   PROCEDURE:  Laparoscopic cholecystectomy with intraoperative cholangiogram.  PREOPERATIVE DIAGNOSIS:  Symptomatic cholelithiasis.  POSTOPERATIVE DIAGNOSIS:  Symptomatic cholelithiasis.  SURGEON:  Dr. Lodema Pilot.  ASSISTANT:  Dr. Sandria Bales. Newman.  ANESTHESIA:  General endotracheal anesthesia with 26 mL of 1% lidocaine with epinephrine, 0.25% Marcaine in 50:50 mixture.  FLUIDS:  150 mL of crystalloid.  ESTIMATED BLOOD LOSS:  Minimal.  DRAINS:  None.  SPECIMENS:  Gallbladder and contents sent to Pathology for permanent sectioning.  FINDINGS:  Normal-appearing gallbladder, very few tiny stones in the gallbladder, normal intraoperative cholangiogram.  COMPLICATIONS:  None apparent.  INDICATION OF PROCEDURE:  Ms. Diebold is a 25 year old female who has had a 41-month history of intermittent epigastric and bilateral upper abdominal discomfort, mainly after eating and worse after eating fatty foods.  She had been seen in the ER multiple times for evaluation and she was scheduled for elective cholecystectomy with Dr. Dwain Sarna, and presented to the emergency room again yesterday with continued symptoms. I discussed with her the risks of the procedure and her somewhat atypical symptoms, and explained that her largest risk would be risk of that her symptoms persist after surgery and that her diarrhea may be increased.  We also discussed the standard risks of injury to bile or bile ducts or bleeding and infection, and she expressed understanding and desired to proceed.  OPERATIVE DETAILS:  She was seen and evaluated in the preop area.  Risks and benefits of the  procedure were discussed in lay terms.  Informed consent was obtained and prophylactic antibiotics were given.  She was taken to the operating room, placed on the table in supine position and general endotracheal anesthesia was obtained.  Abdomen was prepped and draped in a standard surgical fashion.  A supraumbilical midline incision was made in the skin and dissection carried down to the abdominal wall fascia using blunt dissection.  Fascia was sharply incised and elevated between Kocher clamps, and peritoneum was elevated and sharply incised, and a 12-mm balloon trocar was placed at the umbilicus and pneumoperitoneum was obtained.  Her abdomen was inspected and there was no evidence of bowel injury upon entry, and an 11 mm epigastric trocar and two 5-mm right upper quadrant trocars were placed under direct visualization, and the gallbladder was retracted cephalad. There was some omental adhesions which were taken down using blunt dissection and her anatomy appeared routine and the gallbladder was not inflamed.  The cystic duct was easily visualized and the cystic artery was coursing in its normal position on the medial portion of the gallbladder.  The artery was skeletonized and seen identified coursing up onto the gallbladder and was divided between Hemoclips.  This allows to open up the triangle of Calot and obtain a critical view of safety visualizing liver parenchyma through the triangle of Calot.  Cystic duct was skeletonized and clip  was placed on the gallbladder side and a small cystic ductotomy was made, and cholangiogram catheter was placed at the abdominal wall through the 14-gauge Angiocatheter and a cholangiogram was performed which demonstrated normal cystic duct, with right and left hepatic ducts and filling of the common bile duct with no evidence of filling defects, and there was free flow of bile into the duodenum.  A cholangiogram catheter was removed and the cystic  duct was clipped between Hemoclips, two on the cystic side and one on the distal side, and the cystic duct was transected and the gallbladder was removed from the gallbladder fossa using Bovie electrocautery.  There was some spillage of bile from the gallbladder where I had clipped the cystic duct on the gallbladder.  Some of the gallbladder had slipped out of the clip and there was some leakage of bile, but no leakage of stones.  The gallbladder was completely removed from the gallbladder fossa using Bovie electrocautery.  It was removed from the abdomen through the umbilical incision in an EndoCatch bag and inspected on the back table after the case, and was noted to have very few tiny gallstones and a single cystic duct.  It was sent to Pathology for permanent sectioning. The gallbladder fossa was inspected for hemostasis which was obtained with Bovie electrocautery.  No evidence of bleeding or bile.  Clips appeared to be in good position and the right upper quadrant was irrigated with sterile saline solution until the irrigation returned clear.  Clips and the gallbladder fossa were again inspected and noted to be adequate, and the right upper quadrant trocars were removed under direct visualization.  The umbilical trocar was removed and the abdominal fascia was closed with interrupted 0 Vicryl sutures, and prior to securing these sutures, the abdomen was reinsufflated to the epigastric trocar and abdominal closure was noted be adequate without any evidence of bowel injury.  Right upper quadrant again appeared hemostatic without evidence of bowel injury and the epigastric trocar was removed.  Skin was injected with total of 26 mL of 1% lidocaine with epinephrine, 0.25% Marcaine in 50:50 mixture.  The skin edges were approximated with 4-0 Monocryl, subcuticular suture, and the skin was washed and dried and Dermabond applied.  All sponge, needle, and instrument counts were correct at the  end of the case.  The patient tolerated the procedure well without apparent complications.          ______________________________ Lodema Pilot, MD     BL/MEDQ  D:  10/23/2010  T:  10/23/2010  Job:  [id number]  Electronically Signed by Lodema Pilot DO on 10/29/2010 12:14:38 AM

## 2010-10-29 NOTE — H&P (Signed)
NAMEHADASA, GASNER NO.:  [id number]  MEDICAL RECORD NO.:  [id number]  LOCATION:  1514                         FACILITY:  [facility]  PHYSICIAN:  Lodema Pilot, MD       DATE OF BIRTH:  [birth year]  DATE OF ADMISSION:  10/22/2010 DATE OF DISCHARGE:                             HISTORY & PHYSICAL   CHIEF COMPLAINT:  Abdominal pain.  HISTORY OF PRESENT ILLNESS:  Jillian Copeland is a 25 year old female with a 1 month history of upper abdominal pain after eating.  She has had no symptoms prior to approximately 1 month ago and has been having bilateral upper abdominal and epigastric discomfort after eating over that time.  She has been in the emergency room before and has been evaluated by Dr. Dwain Sarna for evaluation of this and was already scheduled for cholecystectomy next week.  She states that her symptoms usually go away on their own, but they have been worse over the last week.  She has no associated nausea or vomiting and no fevers or chills. She has already had an ultrasound of her abdomen which demonstrates cholelithiasis, but no evidence of acute cholecystitis.  As I stated her symptoms have been increased this week.  She woke up Tuesday with discomfort.  She took some of the pain medications that she had been prescribed and her symptoms resolved.  Then she had return of her symptoms this afternoon at 1 o'clock where she had bilateral upper abdominal discomfort, which she describes as a "knots" in her abdomen. She denies any radiation of her pain and on presentation in the emergency room, she described this as a 10/10 pain, although her symptoms are down to 5/10 pain now.  She also has some associated diarrhea, but no blood in the stools or black stools.  ALLERGIES:  None.  MEDICATIONS:  She is on albuterol, beclomethasone inhaler, Yasmin, Flonase, and Ambien.  PAST MEDICAL HISTORY:  Significant for asthma.  She had abnormal Pap smear and abdominal pain.   Denies any past surgical history.  SOCIAL HISTORY:  She smokes approximately 10 cigarettes per day and drinks socially.  REVIEW OF SYSTEMS:  She has some diarrhea and abdominal pain, but otherwise noncontributory.  PHYSICAL EXAMINATION:  GENERAL:  She is in no acute distress and nontoxic appearing, resting comfortably in the bed. VITAL SIGNS:  Stable and she is afebrile. HEENT:  Her head is normocephalic and atraumatic.  Sclerae are white and extraocular muscles intact.  Her trachea is midline and she has no stridor. LUNGS:  Clear to auscultation bilaterally. HEART:  Rate is normal with regular rhythm. ABDOMEN:  Soft.  She has minimal upper abdominal tenderness mainly in the epigastric region.  She has no Murphy's sign and she is nondistended and no peritonitis. EXTREMITIES:  Show normal strength and range of motion in all four extremities and pulses are palpable in all four extremities. SKIN:  Normal.  Warm and dry and good skin turgor.  No jaundice.  LABORATORY STUDIES:  Demonstrate white blood cell count of 10.3, hemoglobin of 14.4, hematocrit of 42.9, platelets of 379, 39% neutrophils.  Sodium is 138, potassium is 4.0, chloride is 103, CO2 of 29, BUN of 11, creatinine is 0.78,  glucose 88.  Bilirubin is 0.2, alk phos is 94, AST is 23, ALT is 39, lipase is 26.  RADIOGRAPHIC STUDIES:  Demonstrate small gallstones within the gallbladder, but no wall thickening or pericholecystic fluid.  Common bile duct is normal, and otherwise normal abdominal ultrasound.  ASSESSMENT:  Cholelithiasis and abdominal pain.  I do think that many of her symptoms could be attributed to her cholelithiasis and I have offered her cholecystectomy.  She does however have diarrhea and some left upper abdominal tenderness as well and she may have other source of these symptoms as well.  I explained to her my concern of the risk that cholecystectomy may not relieve all of her symptoms that she may  still have, left upper quadrant pain, and that her diarrhea may actually increase after removal of her gallbladder.  She expressed understanding of this and her father was present for this discussion as well.  We also discussed the other risks of this surgery including infection, bleeding, pain, scarring, need for open surgery, injury to bowel or bile ducts and again the possibility of persistent symptoms.  She expressed understanding and desires to proceed with cholecystectomy.  Since she has had fewer episodes which have required ER visit, I have offered to keep her in the hospital tonight and to perform her cholecystectomy as soon as possible or she can keep her already scheduled appointment with Dr. Dwain Sarna on Wednesday.  She states that she has is having repeated pain and would like to proceed with cholecystectomy as soon as possible.  PLAN: 1. To admit her to the hospital. 2. Keep her n.p.o. 3. A plan for cholecystectomy when available.          ______________________________ Lodema Pilot, MD     BL/MEDQ  D:  10/22/2010  T:  10/23/2010  Job:  [id number]  Electronically Signed by Lodema Pilot DO on 10/29/2010 12:14:33 AM

## 2010-11-25 ENCOUNTER — Encounter (INDEPENDENT_AMBULATORY_CARE_PROVIDER_SITE_OTHER): Payer: Self-pay | Admitting: General Surgery

## 2010-11-25 ENCOUNTER — Ambulatory Visit (INDEPENDENT_AMBULATORY_CARE_PROVIDER_SITE_OTHER): Payer: BC Managed Care – PPO | Admitting: General Surgery

## 2010-11-25 VITALS — BP 130/82 | HR 70 | Temp 96.9°F | Resp 16 | Ht 63.5 in | Wt 192.0 lb

## 2010-11-25 DIAGNOSIS — Z5189 Encounter for other specified aftercare: Secondary | ICD-10-CM

## 2010-11-25 DIAGNOSIS — Z4889 Encounter for other specified surgical aftercare: Secondary | ICD-10-CM

## 2010-11-25 NOTE — Progress Notes (Signed)
Subjective:     Patient ID: Jillian Copeland, female   DOB: 09-03-1985, 25 y.o.   MRN: [id number]  HPI Patient follows up approximately one month status post a cholecystectomy for abdominal pain. She states that she has been doing well and denies any of her preoperative pain. She is off pain medications and has returned to work and the gym. She'd her bowels are functioning and has actually improved although she still has some immediate bowel movements after eating fatty foods. Her pathology was benign.  Review of Systems     Objective:   Physical Exam No acute distress and nontoxic-appearing  Her abdomen is soft nontender and exam her incisions are healing well without sign of infection    Assessment:     Status post arthroscopic cholecystectomy-doing well it appears that her symptoms that she was having preoperatively had resolved. She still has some bowel irregularities but this is not abnormal for her. I suspect that some of this will continued to improve with time. But overall she seems to be doing much better than preoperatively.    Plan:     Return to activity as tolerated and follow up on a p.r.n. basis

## 2011-01-18 HISTORY — PX: PILONIDAL CYST DRAINAGE: SHX743

## 2011-03-22 ENCOUNTER — Emergency Department (INDEPENDENT_AMBULATORY_CARE_PROVIDER_SITE_OTHER)
Admission: EM | Admit: 2011-03-22 | Discharge: 2011-03-22 | Disposition: A | Discharge status: Home / Self Care | Payer: Managed Care, Other (non HMO) | Source: Home / Self Care | Attending: Emergency Medicine | Admitting: Emergency Medicine

## 2011-03-22 ENCOUNTER — Encounter (HOSPITAL_COMMUNITY): Payer: Self-pay | Admitting: Emergency Medicine

## 2011-03-22 DIAGNOSIS — L0591 Pilonidal cyst without abscess: Secondary | ICD-10-CM

## 2011-03-22 MED ORDER — BACITRACIN 500 UNIT/GM EX OINT: 1.0000 "application " | TOPICAL_OINTMENT | Freq: Once | CUTANEOUS | Status: DC

## 2011-03-22 MED ORDER — HYDROCODONE-ACETAMINOPHEN 5-325 MG PO TABS: 2.0000 | ORAL_TABLET | ORAL | Status: AC | PRN

## 2011-03-22 MED ORDER — SULFAMETHOXAZOLE-TRIMETHOPRIM 800-160 MG PO TABS: 1.0000 | ORAL_TABLET | Freq: Two times a day (BID) | ORAL | Status: AC

## 2011-03-22 MED ORDER — LIDOCAINE-EPINEPHRINE 2 %-1:100000 IJ SOLN: 5.0000 mL | Freq: Once | INTRAMUSCULAR | Status: DC

## 2011-03-22 MED ORDER — IBUPROFEN 600 MG PO TABS: 600.0000 mg | ORAL_TABLET | Freq: Four times a day (QID) | ORAL | Status: AC | PRN

## 2011-03-22 NOTE — Discharge Instructions (Signed)
Pilonidal Cyst Care After A pilonidal cyst occurs when hairs get trapped (ingrown) beneath the skin in the crease between the buttocks over your sacrum (the bone under that crease). Pilonidal cysts are most common in young men with a lot of body hair. When the cyst breaks(ruptured) or leaks, fluid from the cyst may cause burning and itching. If the cyst becomes infected, it causes a painful swelling filled with pus (abscess). The pus and trapped hairs need to be removed (often by lancing) so that the infection can heal. The word pilonidal means hair nest. HOME CARE INSTRUCTIONS If the pilonidal sinus was NOT DRAINING OR LANCED:  Keep the area clean and dry. Bathe or shower daily. Wash the area well with a germ-killing soap. Hot tub baths may help prevent infection. Dry the area well with a towel.   Avoid tight clothing in order to keep area as moisture-free as possible.   Keep area between buttocks as free from hair as possible. A depilatory may be used.   Take antibiotics as directed.   Only take over-the-counter or prescription medicines for pain, discomfort, or fever as directed by your caregiver.  If the cyst WAS INFECTED AND NEEDED TO BE DRAINED:  Your caregiver may have packed the wound with gauze to keep the wound open. This allows the wound to heal from the inside outward and continue to drain.   Return as directed for a wound check.   If you take tub baths or showers, repack the wound with gauze as directed following. Sponge baths are a good alternative. Sitz baths may be used three to four times a day or as directed.   If an antibiotic was ordered to fight the infection, take as directed.   Only take over-the-counter or prescription medicines for pain, discomfort, or fever as directed by your caregiver.   If a drain was in place and removed, use sitz baths for 20 minutes 4 times per day. Clean the wound gently with mild unscented soap, pat dry, and then apply a dry dressing as  directed.  If you had surgery and IT WAS MARSUPIALIZED (LEFT OPEN):  Your wound was packed with gauze to keep the wound open. This allows the wound to heal from the inside outwards and continue draining. The changing of the dressing regularly also helps keep the wound clean.   Return as directed for a wound check.   If you take tub baths or showers, repack the wound with gauze as directed following. Sponge baths are a good alternative. Sitz baths can also be used. This may be done three to four times a day or as directed.   If an antibiotic was ordered to fight the infection, take as directed.   Only take over-the-counter or prescription medicines for pain, discomfort, or fever as directed by your caregiver.   If you had surgery and the wound was closed you may care for it as directed. This generally includes keeping it dry and clean and dressing it as directed.  SEEK MEDICAL CARE IF:   You have increased pain, swelling, redness, drainage, or bleeding from the area.   You have a fever.   You have muscles aches, dizziness, or a general ill feeling.  Document Released: 02/03/2006 Document Revised: 12/23/2010 Document Reviewed: 04/20/2006 [employer] Patient Information 2012 [employer]. 

## 2011-03-22 NOTE — ED Provider Notes (Signed)
History     CSN: [id number]  Arrival date & time 03/22/11  1644   First MD Initiated Contact with Patient 03/22/11 1705      Chief Complaint  Patient presents with  . Abscess    (Consider location/radiation/quality/duration/timing/severity/associated sxs/prior treatment) HPI Comments: Patient reports a painful, erythematous mass at her right gluteal cleft starting a week ago. States she has had intermittent pain and swelling here before, but it has never become this severe or that lasted this long. No trauma to the area. No nausea, vomiting, fevers. No expressible drainage. Pain worse with palpation. No alleviating factors. Patient has not tried anything for this. No history of diabetes.  ROS as noted in HPI. All other ROS negative.   Patient is a 26 y.o. female presenting with abscess. The history is provided by the patient. No language interpreter was used.  Abscess  This is a recurrent problem. The current episode started more than one week ago. The onset was gradual. The problem has been gradually worsening. The abscess is present on the right buttock. The abscess is characterized by redness, painfulness and swelling. It is unknown what she was exposed to. Pertinent negatives include no fever.    Past Medical History  Diagnosis Date  . Asthma   . Abnormal finding on Pap smear, ASCUS     with neg hpv  . Personal history of amenorrhea     oligo  . Chest pain     eval in er in July    Past Surgical History  Procedure Date  . Cholecystectomy 2012    History reviewed. No pertinent family history.  History  Substance Use Topics  . Smoking status: Current Everyday Smoker  . Smokeless tobacco: Not on file  . Alcohol Use: Yes     social    OB History    Grav Para Term Preterm Abortions TAB SAB Ect Mult Living                  Review of Systems  Constitutional: Negative for fever.    Allergies  Review of patient's allergies indicates no known allergies.  Home  Medications   Current Outpatient Rx  Name Route Sig Dispense Refill  . ALBUTEROL SULFATE HFA 108 (90 BASE) MCG/ACT IN AERS Inhalation Inhale 2 puffs into the lungs every 6 (six) hours as needed.      . BECLOMETHASONE DIPROPIONATE 80 MCG/ACT IN AERS Inhalation Inhale 1 puff into the lungs as needed.      Marland Kitchen FLUTICASONE PROPIONATE 50 MCG/ACT NA SUSP Nasal 2 sprays by Nasal route daily.      Marland Kitchen HYDROCODONE-ACETAMINOPHEN 5-325 MG PO TABS Oral Take 2 tablets by mouth every 4 (four) hours as needed for pain. 20 tablet 0  . IBUPROFEN 600 MG PO TABS Oral Take 1 tablet (600 mg total) by mouth every 6 (six) hours as needed for pain. 30 tablet 0  . SULFAMETHOXAZOLE-TRIMETHOPRIM 800-160 MG PO TABS Oral Take 1 tablet by mouth 2 (two) times daily. 20 tablet 0    BP 115/75  Pulse 80  Temp(Src) 98.3 F (36.8 C) (Oral)  Resp 18  SpO2 100%  LMP 09/22/2010  Physical Exam  Nursing note and vitals reviewed. Constitutional: She is oriented to person, place, and time. She appears well-developed and well-nourished. No distress.  HENT:  Head: Normocephalic and atraumatic.  Eyes: Conjunctivae and EOM are normal.  Neck: Normal range of motion.  Cardiovascular: Normal rate.   Pulmonary/Chest: Effort normal.  Abdominal: She  exhibits no distension.  Musculoskeletal: Normal range of motion.  Neurological: She is alert and oriented to person, place, and time.  Skin: Skin is warm and dry.       5 x 3 cm area of tenderness, redness, induration by right gluteal cleft. Expressible drainage. Skin intact. Small amount of central fluctuance.  Psychiatric: She has a normal mood and affect. Her behavior is normal. Judgment and thought content normal.    ED Course  INCISION AND DRAINAGE Date/Time: 03/22/2011 11:00 PM Performed by: Luiz Blare Authorized by: Luiz Blare Consent: Verbal consent obtained. Risks and benefits: risks, benefits and alternatives were discussed Consent given by:  patient Patient understanding: patient states understanding of the procedure being performed Patient consent: the patient's understanding of the procedure matches consent given Site marked: the operative site was marked Required items: required blood products, implants, devices, and special equipment available Patient identity confirmed: verbally with patient Time out: Immediately prior to procedure a "time out" was called to verify the correct patient, procedure, equipment, support staff and site/side marked as required. Type: pilonidal cyst Body area: anogenital Location details: gluteal cleft Anesthesia: local infiltration Local anesthetic: lidocaine 2% with epinephrine Anesthetic total: 2 ml Patient sedated: no Scalpel size: 11 Incision type: single straight Drainage: bloody Drainage amount: scant Wound treatment: wound left open Packing material: none Patient tolerance: Patient tolerated the procedure well with no immediate complications. Comments: Marked area of induration with a surgical marker. Applied bacitracin and sterile dressing.   (including critical care time)  Labs Reviewed - No data to display No results found.   1. Infected pilonidal cyst       MDM  5x3 infected pilonidal cyst r gluteal cleft.  Small amt fluctuance, attempted I&D with no drainage. Will start her on Bactrim, NSAIDs, Norco, warm compresses,  followup with surgery in several days.  Luiz Blare, MD 03/22/11 [phone]

## 2011-03-22 NOTE — ED Notes (Signed)
Pt. Stated, I have a boil on my back at the beginning of my butt for a week, very sore it hurts so bad.

## 2011-03-23 ENCOUNTER — Encounter: Payer: Self-pay | Admitting: Internal Medicine

## 2011-03-23 ENCOUNTER — Encounter (INDEPENDENT_AMBULATORY_CARE_PROVIDER_SITE_OTHER): Payer: Managed Care, Other (non HMO) | Admitting: Surgery

## 2011-03-23 ENCOUNTER — Ambulatory Visit (INDEPENDENT_AMBULATORY_CARE_PROVIDER_SITE_OTHER): Payer: Managed Care, Other (non HMO) | Admitting: Internal Medicine

## 2011-03-23 VITALS — BP 112/76 | HR 86 | Temp 98.3°F | Wt 198.0 lb

## 2011-03-23 DIAGNOSIS — T887XXA Unspecified adverse effect of drug or medicament, initial encounter: Secondary | ICD-10-CM

## 2011-03-23 DIAGNOSIS — L0501 Pilonidal cyst with abscess: Secondary | ICD-10-CM

## 2011-03-23 MED ORDER — CEPHALEXIN 500 MG PO CAPS: 500.0000 mg | ORAL_CAPSULE | Freq: Three times a day (TID) | ORAL | Status: AC

## 2011-03-23 NOTE — Patient Instructions (Signed)
Keep surgery appt.   Tomorrow  Can change antibiotic   To keflex in the mean time.  Warm compresses and showers may help  Also.

## 2011-03-23 NOTE — Progress Notes (Signed)
  Subjective:    Patient ID: Jillian Copeland, female    DOB: 1985-02-18, 26 y.o.   MRN: [id number]  HPI Patient comes in today for SDA for  new problem evaluation.  Has had pain and swelling in the buttock area for days . Went to the urgent care last pm and had drainage attempt  but no pus came out put on septra tmp.  had nausea.   Asks for different . Med  And opinion about going to surgeon.   No fever no hx of I and D  Has had hx of pain off and on for a while . Pain may be? Worse  Hurts to sit.   Review of Systems Neg cp sob.   Hx of same   No rash no vomiting   Past history family history social history reviewed in the electronic medical record.     Objective:   Physical Exam WDWN in nad  Buttocks area  large swollen area left of center and cnetral pitting  Minimal redness but tender.   Small 1-2 mm entry point noted  Just lateral to midline.   And closed   Gait mildly antalgic  Has Ho from urgent care  Reviewed     Assessment & Plan:  Pilonidal cyst abscess   Options discussed  Agree with surgical I& d  apparently unsuccessful in the urgent care at that time.  Pt declined 4 pm appt but will be seen tomorrow after noon.Marland Kitchen   Antibiotic changed.    Expectant management. And fu explained .

## 2011-03-24 ENCOUNTER — Ambulatory Visit (INDEPENDENT_AMBULATORY_CARE_PROVIDER_SITE_OTHER): Payer: Managed Care, Other (non HMO) | Admitting: Surgery

## 2011-03-24 ENCOUNTER — Encounter (INDEPENDENT_AMBULATORY_CARE_PROVIDER_SITE_OTHER): Payer: Self-pay | Admitting: Surgery

## 2011-03-24 DIAGNOSIS — L0591 Pilonidal cyst without abscess: Secondary | ICD-10-CM

## 2011-03-24 DIAGNOSIS — L0501 Pilonidal cyst with abscess: Secondary | ICD-10-CM

## 2011-03-24 HISTORY — DX: Pilonidal cyst with abscess: L05.01

## 2011-03-24 NOTE — Progress Notes (Signed)
[facility]  Ovidio Kin, MD,  FACS [address] [place]  [address] [place], [place]    [address] Phone:  [phone] FAX:  [phone]   Re:   Jillian Copeland DOB:   [birth year] MRN:   [id number]  Urgent Office  ASSESSMENT AND PLAN: 1.  Pilonidal abscess  I&D in office 03/24/2010.  To do sitz baths 3x/day.  Has Kelfex as antibiotic.  Vicodin for pain.  See me back in 7 to 10 days.  2.  Smoking, knows it is bad for her health.  HISTORY OF PRESENT ILLNESS: Chief Complaint  Patient presents with  . Pain    Jillian Copeland is a 26 y.o. (DOB: 03-31-1985)  AA female who is a patient of Lorretta Harp, MD, MD and comes to me today for pilonidal cyst.  The patient has no prior history of pilonidal infection. She noticed pain between her buttocks last Tuesday, 15 March 2011. Because of worsening pain she went to the [facility] urgent care on Tuesday, 22 March 2011. They tried to do an incision and drainage of this infection. But were not very successful. She went to see Dr. Berniece Andreas yesterday and Dr. Berniece Andreas referred her to our office today.  She was first given Septra as antibiotic, but could not tolerate that. She was switched to Keflex as antibiotics.  ROS:  Smokes cigarettes and knows it is bad for her health. Had lap chole by Dr. Trude Mcburney - 10/23/2010.  Social:  She is not married and has no children. 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   PHYSICAL EXAM: BP 117/79  Pulse 85  Temp(Src) 98 F (36.7 C) (Temporal)  Resp 16  Ht 5' 3.5" (1.613 m)  Wt 195 lb (88.451 kg)  BMI 34.00 kg/m2  LMP 09/22/2010  Buttocks:  Inflammation and swelling in the intergluteal cleft c/w a pilonidal abcess.  Procedure:  Buttocks painted with betadine.  Infiltrated with 1% xylocaine.  Drained about 30 cc pus from abscess and packed the wound with gauze.  DATA REVIEWED: Dr. Rosezella [place] note.   Ovidio Kin, MD, FACS Office:   [phone]

## 2011-03-24 NOTE — Patient Instructions (Signed)
1.  Remove packing tomorrow AM.  2.  Do sitz baths 3 times per day.  3.  May use gauze or sanitary pad to cover wound. Do not put antibiotics or cream in wound.

## 2011-04-01 ENCOUNTER — Encounter (INDEPENDENT_AMBULATORY_CARE_PROVIDER_SITE_OTHER): Payer: Managed Care, Other (non HMO) | Admitting: Surgery

## 2011-04-13 ENCOUNTER — Encounter (INDEPENDENT_AMBULATORY_CARE_PROVIDER_SITE_OTHER): Payer: Self-pay | Admitting: Surgery

## 2011-05-31 ENCOUNTER — Encounter: Payer: Self-pay | Admitting: Internal Medicine

## 2011-05-31 ENCOUNTER — Ambulatory Visit (INDEPENDENT_AMBULATORY_CARE_PROVIDER_SITE_OTHER): Payer: Managed Care, Other (non HMO) | Admitting: Internal Medicine

## 2011-05-31 VITALS — BP 120/80 | HR 90 | Temp 97.8°F | Wt 193.0 lb

## 2011-05-31 DIAGNOSIS — J45909 Unspecified asthma, uncomplicated: Secondary | ICD-10-CM

## 2011-05-31 DIAGNOSIS — J309 Allergic rhinitis, unspecified: Secondary | ICD-10-CM | POA: Insufficient documentation

## 2011-05-31 DIAGNOSIS — J3489 Other specified disorders of nose and nasal sinuses: Secondary | ICD-10-CM

## 2011-05-31 DIAGNOSIS — R0981 Nasal congestion: Secondary | ICD-10-CM

## 2011-05-31 MED ORDER — PREDNISONE 20 MG PO TABS: ORAL_TABLET | ORAL | Status: AC

## 2011-05-31 NOTE — Progress Notes (Signed)
  Subjective:    Patient ID: Jillian Copeland, female    DOB: 05/01/1985, 26 y.o.   MRN: [id number]  HPI Patient comes in today for SDA for  new problem evaluation. Onset 3 days ago with all of the above sx. Cough st congestion without face pain . Has tried  Mucinex.   Allergy med.  No seeming to help. continuing and no one else sick at home Is now seeing allergist  on allergy shots .  And given astepro/nasonex and another allergy pill ? name Cough tight  Not wheezing at this time. ? What to do next. Review of Systems Neg cp sob fever ha face pain nvd rash   Past history family history social history reviewed in the electronic medical record. Outpatient Prescriptions Prior to Visit  Medication Sig Dispense Refill  . albuterol (PROAIR HFA) 108 (90 BASE) MCG/ACT inhaler Inhale 2 puffs into the lungs every 6 (six) hours as needed.        . beclomethasone (QVAR) 80 MCG/ACT inhaler Inhale 1 puff into the lungs as needed.        . fluticasone (FLONASE) 50 MCG/ACT nasal spray 2 sprays by Nasal route daily.             Objective:   Physical Exam BP 120/80  Pulse 90  Temp(Src) 97.8 F (36.6 C) (Oral)  Wt 193 lb (87.544 kg)  SpO2 99% Very congested wdwn in nad WDWN in NAD  quiet respirations; congested  somewhat hoarse. Non toxic . HEENT: Normocephalic ;atraumatic , Eyes;  PERRL, EOMs  Full, lids and conjunctiva clear,,Ears: no deformities, canals nl, TM landmarks normal, Nose: no deformity or discharge but congested;face minimally tender Mouth : OP clear without lesion or edema . Neck: Supple without adenopathy or masses or bruits Chest:  Clear to A&P without wheezes rales or rhonchi  ? Dec bs  CV:  S1-S2 no gallops or murmurs peripheral perfusion is normal Skin :nl perfusion and no acute rashes      Assessment & Plan:  Acute ur congestion viral vs allergic   Asthma on allergy shots  Add decongestant and pred if continuing and  Cont with allergy meds,  Call with med name    Inhaler as needed

## 2011-05-31 NOTE — Patient Instructions (Signed)
It is possible that the reason your allergy medicine isn't working as well is because you have a viral respiratory infection like a head cold.  This will get better on its own in 7-10 days. However because of her severe congestion as possible your allergy is adding to this.  Add decongestant such as pseudoephedrine or phenylephrine and take this over the next 24-48 hours. Mucinex D. is one of these preparations If her nasal congestion is not improving or your asthma begins to flare then  take the 5 days of prednisone. Use your inhaler as needed.     Upper Respiratory Infection, Adult An upper respiratory infection (URI) is also sometimes known as the common cold. The upper respiratory tract includes the nose, sinuses, throat, trachea, and bronchi. Bronchi are the airways leading to the lungs. Most people improve within 1 week, but symptoms can last up to 2 weeks. A residual cough may last even longer.  CAUSES Many different viruses can infect the tissues lining the upper respiratory tract. The tissues become irritated and inflamed and often become very moist. Mucus production is also common. A cold is contagious. You can easily spread the virus to others by oral contact. This includes kissing, sharing a glass, coughing, or sneezing. Touching your mouth or nose and then touching a surface, which is then touched by another person, can also spread the virus. SYMPTOMS  Symptoms typically develop 1 to 3 days after you come in contact with a cold virus. Symptoms vary from person to person. They may include:  Runny nose.   Sneezing.   Nasal congestion.   Sinus irritation.   Sore throat.   Loss of voice (laryngitis).   Cough.   Fatigue.   Muscle aches.   Loss of appetite.   Headache.   Low-grade fever.  DIAGNOSIS  You might diagnose your own cold based on familiar symptoms, since most people get a cold 2 to 3 times a year. Your caregiver can confirm this based on your exam. Most  importantly, your caregiver can check that your symptoms are not due to another disease such as strep throat, sinusitis, pneumonia, asthma, or epiglottitis. Blood tests, throat tests, and X-rays are not necessary to diagnose a common cold, but they may sometimes be helpful in excluding other more serious diseases. Your caregiver will decide if any further tests are required. RISKS AND COMPLICATIONS  You may be at risk for a more severe case of the common cold if you smoke cigarettes, have chronic heart disease (such as heart failure) or lung disease (such as asthma), or if you have a weakened immune system. The very young and very old are also at risk for more serious infections. Bacterial sinusitis, middle ear infections, and bacterial pneumonia can complicate the common cold. The common cold can worsen asthma and chronic obstructive pulmonary disease (COPD). Sometimes, these complications can require emergency medical care and may be life-threatening. PREVENTION  The best way to protect against getting a cold is to practice good hygiene. Avoid oral or hand contact with people with cold symptoms. Wash your hands often if contact occurs. There is no clear evidence that vitamin C, vitamin E, echinacea, or exercise reduces the chance of developing a cold. However, it is always recommended to get plenty of rest and practice good nutrition. TREATMENT  Treatment is directed at relieving symptoms. There is no cure. Antibiotics are not effective, because the infection is caused by a virus, not by bacteria. Treatment may include:  Increased fluid  intake. Sports drinks offer valuable electrolytes, sugars, and fluids.   Breathing heated mist or steam (vaporizer or shower).   Eating chicken soup or other clear broths, and maintaining good nutrition.   Getting plenty of rest.   Using gargles or lozenges for comfort.   Controlling fevers with ibuprofen or acetaminophen as directed by your caregiver.    Increasing usage of your inhaler if you have asthma.  Zinc gel and zinc lozenges, taken in the first 24 hours of the common cold, can shorten the duration and lessen the severity of symptoms. Pain medicines may help with fever, muscle aches, and throat pain. A variety of non-prescription medicines are available to treat congestion and runny nose. Your caregiver can make recommendations and may suggest nasal or lung inhalers for other symptoms.  HOME CARE INSTRUCTIONS   Only take over-the-counter or prescription medicines for pain, discomfort, or fever as directed by your caregiver.   Use a warm mist humidifier or inhale steam from a shower to increase air moisture. This may keep secretions moist and make it easier to breathe.   Drink enough water and fluids to keep your urine clear or pale yellow.   Rest as needed.   Return to work when your temperature has returned to normal or as your caregiver advises. You may need to stay home longer to avoid infecting others. You can also use a face mask and careful hand washing to prevent spread of the virus.  SEEK MEDICAL CARE IF:   After the first few days, you feel you are getting worse rather than better.   You need your caregiver's advice about medicines to control symptoms.   You develop chills, worsening shortness of breath, or brown or red sputum. These may be signs of pneumonia.   You develop yellow or brown nasal discharge or pain in the face, especially when you bend forward. These may be signs of sinusitis.   You develop a fever, swollen neck glands, pain with swallowing, or white areas in the back of your throat. These may be signs of strep throat.  SEEK IMMEDIATE MEDICAL CARE IF:   You have a fever.   You develop severe or persistent headache, ear pain, sinus pain, or chest pain.   You develop wheezing, a prolonged cough, cough up blood, or have a change in your usual mucus (if you have chronic lung disease).   You develop sore  muscles or a stiff neck.  Document Released: 06/29/2000 Document Revised: 12/23/2010 Document Reviewed: 05/07/2010 [facility] Patient Information 2012 [place], [place].

## 2011-06-30 ENCOUNTER — Institutional Professional Consult (permissible substitution): Payer: Managed Care, Other (non HMO) | Admitting: Internal Medicine

## 2011-07-25 ENCOUNTER — Ambulatory Visit (INDEPENDENT_AMBULATORY_CARE_PROVIDER_SITE_OTHER): Payer: Managed Care, Other (non HMO) | Admitting: General Surgery

## 2011-07-25 ENCOUNTER — Encounter (INDEPENDENT_AMBULATORY_CARE_PROVIDER_SITE_OTHER): Payer: Self-pay | Admitting: General Surgery

## 2011-07-25 VITALS — BP 124/78 | HR 68 | Temp 97.4°F | Resp 14 | Ht 63.5 in | Wt 189.1 lb

## 2011-07-25 DIAGNOSIS — L0501 Pilonidal cyst with abscess: Secondary | ICD-10-CM

## 2011-07-25 MED ORDER — CEPHALEXIN 500 MG PO CAPS: 500.0000 mg | ORAL_CAPSULE | Freq: Two times a day (BID) | ORAL | Status: AC

## 2011-07-25 MED ORDER — OXYCODONE-ACETAMINOPHEN 5-325 MG PO TABS: 1.0000 | ORAL_TABLET | ORAL | Status: DC | PRN

## 2011-07-25 NOTE — Progress Notes (Signed)
Subjective:     Patient ID: Jillian Copeland, female   DOB: 1985-08-15, 26 y.o.   MRN: [id number]  HPI 84 yof seen by Dr. Ezzard Standing in past for pilonidal abscess.  She over past several days has had recurrence of a mass in top of her gluteal cleft associated with pain.  This has started draining purulent fluid today.  She denies fevers.   Review of Systems     Objective:   Physical Exam At top of gluteal cleft she has tender mass right greater than left with induration, there is a 1x1 cm hole in the middle of the cleft already present draining purulent fluid, this opened further when I examiner her, No real erythema, she is not hirsute, I probed this area and feel it is adequately drained and packed it.    Assessment:     Likely pilonidal abscess    Plan:     It is in correct position for pilonidal disease although no real hair around it.  I packed this and think this is well drained. She will leave her packing in and will see Dr. Ezzard Standing Wednesday.

## 2011-07-27 ENCOUNTER — Ambulatory Visit (INDEPENDENT_AMBULATORY_CARE_PROVIDER_SITE_OTHER): Payer: Managed Care, Other (non HMO) | Admitting: Surgery

## 2011-07-27 ENCOUNTER — Encounter (INDEPENDENT_AMBULATORY_CARE_PROVIDER_SITE_OTHER): Payer: Self-pay | Admitting: Surgery

## 2011-07-27 VITALS — BP 126/90 | HR 102 | Temp 97.3°F | Ht 63.0 in | Wt 190.4 lb

## 2011-07-27 DIAGNOSIS — L0501 Pilonidal cyst with abscess: Secondary | ICD-10-CM

## 2011-07-27 NOTE — Progress Notes (Addendum)
[facility]  Ovidio Kin, MD,  FACS [address] [place]  [address] [place], [place]    [address] Phone:  [phone] FAX:  [phone]   Re:   Jillian Copeland DOB:   October 10, 1985 MRN:   [id number]  ASSESSMENT AND PLAN: 1.  Pilonidal abscess  I did an I&D in office 03/24/2011.  She failed follow up with me.  She saw Dr. Dwain Sarna on 07/25/2011.  He then sent her back to me for a recurrent pilonidal.  She needs to to sitz baths TID.  I am going to give this 3 to 6 weeks to heal, then consider resecting the area.  She was again given literature on pilonidal cysts/infections.  2.  Smoking, knows it is bad for her health.  HISTORY OF PRESENT ILLNESS: Chief Complaint  Patient presents with  . Follow-up    reck pil cyst    Jillian Copeland is a 26 y.o. (DOB: 1985/11/19)  AA female who is a patient of Lorretta Harp, MD and comes to me today for pilonidal cyst.  I saw her originally in March 2013, but she failed follow up.  The pilonidal cyst came back on 07/21/2011.  She then saw Dr. Dwain Sarna on Monday, 07/25/2011, and he sent her back to me today.  ROS:  Smokes cigarettes and knows it is bad for her health. Had lap chole by Dr. Trude Mcburney - 10/23/2010.  Social:  She is not married and has no children.  Lives with parents. 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  She works 3rd shift.  PHYSICAL EXAM: BP 126/90  Pulse 102  Temp 97.3 F (36.3 C) (Temporal)  Ht 5\' 3"  (1.6 m)  Wt 190 lb 6.4 oz (86.365 kg)  BMI 33.73 kg/m2  SpO2 98%  Buttocks:  She has a 1.5 cm hole draining a larger area (about 3 to 4 cm) that is somewhat left sided.  I removed packing in the wound.  It is reasonably clean.  DATA REVIEWED: Dr. Doreen Salvage notes.  Ovidio Kin, MD, FACS Office:  [phone]

## 2011-08-31 ENCOUNTER — Encounter (INDEPENDENT_AMBULATORY_CARE_PROVIDER_SITE_OTHER): Payer: Self-pay | Admitting: Surgery

## 2011-08-31 ENCOUNTER — Ambulatory Visit (INDEPENDENT_AMBULATORY_CARE_PROVIDER_SITE_OTHER): Payer: Managed Care, Other (non HMO) | Admitting: Surgery

## 2011-08-31 VITALS — BP 126/82 | HR 96 | Temp 97.2°F | Resp 16 | Ht 63.5 in | Wt 193.2 lb

## 2011-08-31 DIAGNOSIS — L0501 Pilonidal cyst with abscess: Secondary | ICD-10-CM

## 2011-08-31 NOTE — Progress Notes (Signed)
[facility]  Ovidio Kin, MD,  FACS [address] [place]  [address] [place], [place]    [address] Phone:  [phone] FAX:  [phone]   Re:   Jillian Copeland DOB:   1985-06-18 MRN:   [id number]  ASSESSMENT AND PLAN: 1.  Pilonidal abscess  I did an I&D in office 03/24/2011.  She failed follow up with me.  She saw Dr. Dwain Sarna on 07/25/2011.  He then sent her back to me for a recurrent pilonidal.  She had another abscess today and I had do another I&D today - 03/03/2011  I will see her back every 1 to 2 weeks until we get her scheduled for excision.  2.  Smoking, knows it is bad for her health.  HISTORY OF PRESENT ILLNESS: Chief Complaint  Patient presents with  . Follow-up    pil cyst    Jillian Copeland is a 26 y.o. (DOB: April 27, 1985)  AA female who is a patient of Lorretta Harp, MD and comes to me today for pilonidal cyst.  I saw her originally in March 2013, but she failed follow up.  The pilonidal cyst came back on 07/21/2011.  She then saw Dr. Dwain Sarna on Monday, 07/25/2011.   She come back with another abscess today.  ROS:  Smokes cigarettes and knows it is bad for her health. Had lap chole by Dr. Trude Mcburney - 10/23/2010.  Social:  She is not married and has no children.  Lives with parents. 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  She works 3rd shift.  PHYSICAL EXAM: BP 126/82  Pulse 96  Temp 97.2 F (36.2 C) (Temporal)  Resp 16  Ht 5' 3.5" (1.613 m)  Wt 193 lb 3.2 oz (87.635 kg)  BMI 33.69 kg/m2  Buttocks:  She has another pilonidal, about 3 cm in inflammation, just to the left of midline.  Procedure:  While in the office, I painted the wound with choloroprep, infiltrated about 7 cc 1 % xylocaine, and then did a I&D of the abscess.  I made an opening about 2.5 cm and tried to cut out a piece of skin.  DATA REVIEWED: Old notes.  Ovidio Kin, MD, FACS Office:  [phone]

## 2011-09-09 ENCOUNTER — Ambulatory Visit (INDEPENDENT_AMBULATORY_CARE_PROVIDER_SITE_OTHER): Payer: Managed Care, Other (non HMO) | Admitting: Surgery

## 2011-09-09 ENCOUNTER — Encounter (INDEPENDENT_AMBULATORY_CARE_PROVIDER_SITE_OTHER): Payer: Self-pay | Admitting: Surgery

## 2011-09-09 VITALS — BP 122/82 | HR 74 | Temp 97.9°F | Resp 14 | Ht 65.0 in | Wt 191.2 lb

## 2011-09-09 DIAGNOSIS — L0501 Pilonidal cyst with abscess: Secondary | ICD-10-CM

## 2011-09-09 NOTE — Progress Notes (Signed)
[facility]  Ovidio Kin, MD,  FACS [address] [place]  [address] [place], [place]    [address] Phone:  [phone] FAX:  [phone]   Re:   Jillian Copeland DOB:   07-21-1985 MRN:   [id number]  ASSESSMENT AND PLAN: 1.  Pilonidal abscess  I did an I&D in office 03/24/2011.  She failed follow up with me.  She saw Dr. Dwain Sarna on 07/25/2011.   I did another I&D on 08/31/2011.   This time we plan to do definitive surgery when the wound is all healed.  She is doing much better.  I will see her in 2 weeks, with probable plans to schedule surgery on the next visit.   2.  Smoking, knows it is bad for her health.  We again talked about stopping smoking.  HISTORY OF PRESENT ILLNESS: Jillian Copeland is a 26 y.o. (DOB: 02-13-1985)  AA female who is a patient of Lorretta Harp, MD and comes to me today for pilonidal cyst.  I did a I&D of a recurrent pilonidal on 08/31/2011.  She is here for follow up.  She is doing much better.  Again, we talked about quitting smoking.  I reinforced how smoking delays wound healing.  ROS:  Smokes cigarettes and knows it is bad for her health. Had lap chole by Dr. Trude Mcburney - 10/23/2010.  Social:  She is not married and has no children.  Lives with parents. 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  She works 3rd shift.  PHYSICAL EXAM: BP 122/82  Pulse 74  Temp 97.9 F (36.6 C)  Resp 14  Ht 5\' 5"  (1.651 m)  Wt 191 lb 3.2 oz (86.728 kg)  BMI 31.82 kg/m2  Buttocks:  The wound is almost healed.  There is a 2 cm area that is open and superficial.  DATA REVIEWED: Old notes.  Ovidio Kin, MD, FACS Office:  [phone]

## 2011-09-21 ENCOUNTER — Encounter (INDEPENDENT_AMBULATORY_CARE_PROVIDER_SITE_OTHER): Payer: Self-pay | Admitting: Surgery

## 2011-09-21 ENCOUNTER — Ambulatory Visit (INDEPENDENT_AMBULATORY_CARE_PROVIDER_SITE_OTHER): Payer: Managed Care, Other (non HMO) | Admitting: Surgery

## 2011-09-21 VITALS — BP 122/80 | HR 80 | Temp 97.6°F | Resp 18 | Ht 64.0 in | Wt 190.4 lb

## 2011-09-21 DIAGNOSIS — L0501 Pilonidal cyst with abscess: Secondary | ICD-10-CM

## 2011-09-21 NOTE — Progress Notes (Signed)
[facility]  Ovidio Kin, MD,  FACS [address]  [address] [place], [place]    [address] Phone:  [phone] FAX:  [phone]   Re:   Jillian Copeland DOB:   1985/03/09 MRN:   [id number]  ASSESSMENT AND PLAN: 1.  Pilonidal abscess  I did an I&D in office 03/24/2011.  She failed follow up with me.  She saw Dr. Dwain Sarna on 07/25/2011.   I did another I&D on 08/31/2011.   This time we plan to do definitive surgery when the wound is all healed.  We discussed planned surgery.  The benefits and the risks.  I have given her a sheet on pilonidal cysts that outlines the surgery and its recovery.  She has a trip planned next week, so she wants to do this at the end of the month.  We talked about being out of work for 1 week after surgery.   2.  Smoking, knows it is bad for her health.  She is down to 2 cigarettes a day.  HISTORY OF PRESENT ILLNESS: Jillian Copeland is a 25 y.o. (DOB: [birth year])  AA female who is a patient of Lorretta Harp, MD and comes to me today for pilonidal cyst.  I did an I&D of a recurrent pilonidal on 08/31/2011.  The wound is healed and we will schedule her for surgery.  I gave her another sheet on pilonidal cysts.  ROS:  Smokes cigarettes and knows it is bad for her health. Had lap chole by Dr. Trude Mcburney - 10/23/2010.  Social:  She is not married and has no children.  Lives with parents. 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  She works 3rd shift.  PHYSICAL EXAM: BP 122/80  Pulse 80  Temp 97.6 F (36.4 C) (Temporal)  Resp 18  Ht 5\' 4"  (1.626 m)  Wt 190 lb 6.4 oz (86.365 kg)  BMI 32.68 kg/m2  SpO2 97%  Buttocks:  The wound is healed.  DATA REVIEWED: Old notes.  Ovidio Kin, MD, FACS Office:  [phone]

## 2011-10-21 ENCOUNTER — Other Ambulatory Visit (INDEPENDENT_AMBULATORY_CARE_PROVIDER_SITE_OTHER): Payer: Self-pay | Admitting: Surgery

## 2011-10-21 DIAGNOSIS — L0591 Pilonidal cyst without abscess: Secondary | ICD-10-CM

## 2011-11-02 ENCOUNTER — Ambulatory Visit (INDEPENDENT_AMBULATORY_CARE_PROVIDER_SITE_OTHER): Payer: Managed Care, Other (non HMO) | Admitting: Surgery

## 2011-11-02 ENCOUNTER — Encounter (INDEPENDENT_AMBULATORY_CARE_PROVIDER_SITE_OTHER): Payer: Self-pay | Admitting: Surgery

## 2011-11-02 VITALS — BP 128/72 | HR 60 | Temp 98.0°F | Resp 18 | Ht 64.0 in | Wt 190.2 lb

## 2011-11-02 DIAGNOSIS — L0501 Pilonidal cyst with abscess: Secondary | ICD-10-CM

## 2011-11-02 NOTE — Progress Notes (Signed)
[facility]  Ovidio Kin, MD,  FACS [address]  [address] [place], [place]    [address] Phone:  [phone] FAX:  [phone]   Re:   Jillian Copeland DOB:   1985-07-23 MRN:   [id number]  ASSESSMENT AND PLAN: 1.  Pilonidal abscess/cyst [facility] at [facility] - D. Ezzard Standing - 10/21/2011  This is her first follow up.   Her mother is packing it.  It looks good.  Follow up in 2 weeks.  2.  Smoking, knows it is bad for her health.  She is down to 2 cigarettes a day.  HISTORY OF PRESENT ILLNESS: Jillian Copeland is a 26 y.o. (DOB: Aug 05, 1985)  AA female who is a patient of Lorretta Harp, MD and comes to me today for follow up of pilonidal cyst excision.  She is doing well. Her mother is helping pack the wound. She is change the packing 3 times a day.  History of pilonidal cyst: I did an I&D of a pilonidal abscess in the office 03/24/2011.  She failed follow up with me.  She saw Dr. Dwain Sarna on 07/25/2011. I did another I&D of a recurrent pilonidal on 08/31/2011.    ROS:  Smokes cigarettes and knows it is bad for her health. Had lap chole by Dr. Trude Mcburney - 10/23/2010.  Social:  She is not married and has no children.   Lives with parents. 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  She works 3rd shift.  PHYSICAL EXAM: BP 128/72  Pulse 60  Temp 98 F (36.7 C) (Oral)  Resp 18  Ht 5\' 4"  (1.626 m)  Wt 190 lb 3.2 oz (86.274 kg)  BMI 32.65 kg/m2  Buttocks:  The wound is healing.  I cleaned it with peroxide.  DATA REVIEWED: Path report.  Ovidio Kin, MD, FACS Office:  [phone]

## 2011-11-17 ENCOUNTER — Ambulatory Visit (INDEPENDENT_AMBULATORY_CARE_PROVIDER_SITE_OTHER): Payer: Managed Care, Other (non HMO) | Admitting: Surgery

## 2011-11-17 ENCOUNTER — Encounter (INDEPENDENT_AMBULATORY_CARE_PROVIDER_SITE_OTHER): Payer: Self-pay | Admitting: Surgery

## 2011-11-17 VITALS — BP 118/84 | HR 71 | Temp 97.2°F | Resp 16 | Ht 64.0 in | Wt 190.6 lb

## 2011-11-17 DIAGNOSIS — L0501 Pilonidal cyst with abscess: Secondary | ICD-10-CM

## 2011-11-17 NOTE — Progress Notes (Signed)
[facility]  Ovidio Kin, MD,  FACS [address] [place]  [address] [place], [place]    [address] Phone:  [phone] FAX:  [phone]   Re:   Jillian Copeland DOB:   04-09-1985 MRN:   [id number]  ASSESSMENT AND PLAN: 1.  Pilonidal abscess/cyst excised at [facility] - D. Ebelin Dillehay - 10/21/2011   Her mother is packing it.  It looks good.  Follow up in 2 - 3 weeks.  She may go to 2 x per day sitz baths  2.  Smoking, knows it is bad for her health.  She is down to 2 cigarettes a day. 3.  She is going trick or treating with her 72 year old sister.  HISTORY OF PRESENT ILLNESS: Jillian Copeland is a 26 y.o. (DOB: 1985/08/08)  AA female who is a patient of Lorretta Harp, MD and comes to me today for follow up of pilonidal cyst excision.  She is doing well. Her mother is helping pack the wound. She is change the packing 3 times a day.  History of pilonidal cyst: I did an I&D of a pilonidal abscess in the office 03/24/2011.  She failed follow up with me.  She saw Dr. Dwain Sarna on 07/25/2011. I did another I&D of a recurrent pilonidal on 08/31/2011.    ROS:  Smokes cigarettes and knows it is bad for her health. Had lap chole by Dr. Trude Mcburney - 10/23/2010.  Social:  She is not married and has no children.   Lives with parents. 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  She works 3rd shift.  PHYSICAL EXAM: BP 118/84  Pulse 71  Temp 97.2 F (36.2 C) (Temporal)  Resp 16  Ht 5\' 4"  (1.626 m)  Wt 190 lb 9.6 oz (86.456 kg)  BMI 32.72 kg/m2  Buttocks:  The wound is healing.  I cleaned it with peroxide.  There is some overgrowth of granulation tissue, but it is not bad.  DATA REVIEWED: None new.  Ovidio Kin, MD, FACS Office:  [phone]

## 2011-12-07 ENCOUNTER — Encounter (INDEPENDENT_AMBULATORY_CARE_PROVIDER_SITE_OTHER): Payer: Managed Care, Other (non HMO) | Admitting: Surgery

## 2011-12-19 ENCOUNTER — Encounter (INDEPENDENT_AMBULATORY_CARE_PROVIDER_SITE_OTHER): Payer: Managed Care, Other (non HMO) | Admitting: Surgery

## 2012-01-13 ENCOUNTER — Ambulatory Visit (INDEPENDENT_AMBULATORY_CARE_PROVIDER_SITE_OTHER): Payer: Managed Care, Other (non HMO) | Admitting: Family Medicine

## 2012-01-13 ENCOUNTER — Ambulatory Visit (INDEPENDENT_AMBULATORY_CARE_PROVIDER_SITE_OTHER)
Admission: RE | Admit: 2012-01-13 | Discharge: 2012-01-13 | Disposition: A | Discharge status: Home / Self Care | Payer: Managed Care, Other (non HMO) | Source: Ambulatory Visit | Attending: Family Medicine | Admitting: Family Medicine

## 2012-01-13 ENCOUNTER — Encounter: Payer: Self-pay | Admitting: Family Medicine

## 2012-01-13 VITALS — BP 120/80 | HR 116 | Temp 98.0°F | Ht 64.0 in | Wt 194.0 lb

## 2012-01-13 DIAGNOSIS — J309 Allergic rhinitis, unspecified: Secondary | ICD-10-CM

## 2012-01-13 DIAGNOSIS — J45909 Unspecified asthma, uncomplicated: Secondary | ICD-10-CM

## 2012-01-13 DIAGNOSIS — R05 Cough: Secondary | ICD-10-CM

## 2012-01-13 MED ORDER — BECLOMETHASONE DIPROPIONATE 80 MCG/ACT IN AERS: 1.0000 | INHALATION_SPRAY | Freq: Two times a day (BID) | RESPIRATORY_TRACT | Status: DC

## 2012-01-13 MED ORDER — FLUTICASONE PROPIONATE 50 MCG/ACT NA SUSP: 2.0000 | Freq: Every day | NASAL | Status: DC

## 2012-01-13 MED ORDER — GUAIFENESIN-CODEINE 100-10 MG/5ML PO SYRP: 5.0000 mL | ORAL_SOLUTION | Freq: Two times a day (BID) | ORAL | Status: DC | PRN

## 2012-01-13 NOTE — Patient Instructions (Signed)
-  restart QVAR - 1 puff twice daily  -restart flonase 2 sprays each nostirl  -get chest xray - we will only call you IF this is abnormal  -continue your oral allergy medication  -use albuterol as needed  -follow up with your allergy doctor in 2-4 weeks or sooner if worsening or other concerns

## 2012-01-13 NOTE — Progress Notes (Signed)
Chief Complaint  Patient presents with  . Cough    HPI: -started: about 3-4 weeks ago -symptoms:nasal congestion, cough, wheezing, SOB - thinks has infection and asthma flare - out of qvar -last asthma flare requiring prednisone was last year -uses albuterol almost every day -not using flonase -denies:fever, SOB, NVD, tooth pain, strep or mono exposure -has tried: albuterol -sick contacts: mother with upper resp infection -Hx of: asthma -pt wants cheratussin for cough - insist DOES NOT HAVE ALLERGY TO CODEINE   ROS: See pertinent positives and negatives per HPI.  Past Medical History  Diagnosis Date  . Asthma   . Abnormal finding on Pap smear, ASCUS     with neg hpv  . Personal history of amenorrhea     oligo  . Chest pain     eval in er in July  . Pilonidal cyst     No family history on file.  History   Social History  . Marital Status: Single    Spouse Name: N/A    Number of Children: N/A  . Years of Education: N/A   Social History Main Topics  . Smoking status: Current Every Day Smoker    Types: Cigarettes  . Smokeless tobacco: Never Used     Comment: less than 10 a day   . Alcohol Use: Yes     Comment: social  . Drug Use: No  . Sexually Active: None   Other Topics Concern  . 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  , Disp: , Rfl: ;  fluticasone (FLONASE) 50 MCG/ACT nasal spray, Place 2 sprays into the nose daily., Disp: 16 g, Rfl: 0;  metFORMIN (GLUCOPHAGE) 500 MG tablet, Take 500 mg by mouth 2 (two) times daily with a meal., Disp: , Rfl:  beclomethasone (QVAR) 80 MCG/ACT inhaler, Inhale 1 puff into the lungs 2 (two) times daily., Disp: 1 Inhaler, Rfl: 0;  guaiFENesin-codeine (ROBITUSSIN AC) 100-10 MG/5ML syrup, Take 5 mLs by mouth 2  (two) times daily as needed for cough., Disp: 120 mL, Rfl: 0  EXAM:  Filed Vitals:   01/13/12 1418  BP: 120/80  Pulse: 116  Temp: 98 F (36.7 C)    Body mass index is 33.30 kg/(m^2).  GENERAL: vitals reviewed and listed above, alert, oriented, appears well hydrated and in no acute distress  HEENT: atraumatic, conjunttiva clear, no obvious abnormalities on inspection of external nose and ears, pale boggy turbinates with clear rhinorrhea  NECK: no obvious masses on inspection  LUNGS: clear to auscultation bilaterally, no wheezes, rales or rhonchi, good air movement  CV: HRRR, no peripheral edema  MS: moves all extremities without noticeable abnormality  PSYCH: pleasant and cooperative, no obvious depression or anxiety  ASSESSMENT AND PLAN:  Discussed the following assessment and plan:  1. Asthma  beclomethasone (QVAR) 80 MCG/ACT inhaler  2. Allergic rhinitis  fluticasone (FLONASE) 50 MCG/ACT nasal spray  3. Cough  DG Chest 2 View, guaiFENesin-codeine (ROBITUSSIN AC) 100-10 MG/5ML syrup   -uncontrolled allergies and asthma - no serious symptoms currently -appears well, normal pulm exam, no SOB, normal O2 sats -restart ICS and flonase -follow up with asthma doctor -cough medication provided per pt request - insists she IS NOT allergic to codeine -Patient advised to return or notify a doctor  immediately if symptoms worsen or persist or new concerns arise.  Patient Instructions  -restart QVAR - 1 puff twice daily  -restart flonase 2 sprays each nostirl  -get chest xray - we will only call you IF this is abnormal  -continue your oral allergy medication  -use albuterol as needed  -follow up with your allergy doctor in 2-4 weeks or sooner if worsening or other concerns     Niall Illes R.

## 2012-01-27 ENCOUNTER — Ambulatory Visit: Payer: Managed Care, Other (non HMO) | Admitting: Dietician

## 2012-02-01 ENCOUNTER — Telehealth (INDEPENDENT_AMBULATORY_CARE_PROVIDER_SITE_OTHER): Payer: Self-pay

## 2012-02-01 ENCOUNTER — Encounter (INDEPENDENT_AMBULATORY_CARE_PROVIDER_SITE_OTHER): Payer: Managed Care, Other (non HMO) | Admitting: Surgery

## 2012-02-01 NOTE — Telephone Encounter (Signed)
V/M to call to reschedule . Expressed We could see her tomorrow ;ask for [facility]

## 2012-02-09 ENCOUNTER — Ambulatory Visit: Payer: Managed Care, Other (non HMO) | Admitting: Dietician

## 2012-04-23 ENCOUNTER — Encounter: Payer: Self-pay | Admitting: Family Medicine

## 2012-06-21 ENCOUNTER — Encounter: Payer: Self-pay | Admitting: Internal Medicine

## 2012-06-21 ENCOUNTER — Ambulatory Visit (INDEPENDENT_AMBULATORY_CARE_PROVIDER_SITE_OTHER): Payer: BC Managed Care – PPO | Admitting: Internal Medicine

## 2012-06-21 VITALS — BP 110/82 | HR 85 | Temp 97.6°F | Ht 64.0 in | Wt 184.0 lb

## 2012-06-21 DIAGNOSIS — T300 Burn of unspecified body region, unspecified degree: Secondary | ICD-10-CM

## 2012-06-21 MED ORDER — SILVER SULFADIAZINE 1 % EX CREA: TOPICAL_CREAM | Freq: Every day | CUTANEOUS | Status: DC

## 2012-06-21 NOTE — Patient Instructions (Signed)

## 2012-06-21 NOTE — Progress Notes (Signed)
Subjective:    Patient ID: Jillian Copeland, female    DOB: 1985-10-05, 27 y.o.   MRN: [id number]  HPI  Pt presents to the clinic today with c/o left  foot pain. She burned her foot with hot honey that she was trying to microwave. It was blistered prior to her little sister stepping on her foot. Now the blister has opened as in draining. She is having pain on the top of her foot around the area of the burn. The redness surrounding it has gotten better. She denies fever, chills.  Review of Systems      Past Medical History  Diagnosis Date  . Asthma   . Abnormal finding on Pap smear, ASCUS     with neg hpv  . Personal history of amenorrhea     oligo  . Chest pain     eval in er in July  . Pilonidal cyst     Current Outpatient Prescriptions  Medication Sig Dispense Refill  . albuterol (PROAIR HFA) 108 (90 BASE) MCG/ACT inhaler Inhale 2 puffs into the lungs every 6 (six) hours as needed.        . beclomethasone (QVAR) 80 MCG/ACT inhaler Inhale 1 puff into the lungs 2 (two) times daily.  1 Inhaler  0  . fluticasone (FLONASE) 50 MCG/ACT nasal spray Place 2 sprays into the nose daily.  16 g  0  . guaiFENesin-codeine (ROBITUSSIN AC) 100-10 MG/5ML syrup Take 5 mLs by mouth 2 (two) times daily as needed for cough.  120 mL  0  . metFORMIN (GLUCOPHAGE) 500 MG tablet Take 500 mg by mouth 2 (two) times daily with a meal.       No current facility-administered medications for this visit.    Allergies  Allergen Reactions  . Codeine Nausea And Vomiting    History reviewed. No pertinent family history.  History   Social History  . Marital Status: Single    Spouse Name: N/A    Number of Children: N/A  . Years of Education: N/A   Occupational History  . Not on file.   Social History Main Topics  . Smoking status: Current Every Day Smoker    Types: Cigarettes  . Smokeless tobacco: Never Used     Comment: less than 10 a day   . Alcohol Use: Yes     Comment: social  . Drug  Use: No  . Sexually Active: Not on file   Other Topics Concern  . 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  Musculoskeletal: Pt reports foot pain. Denies decrease in range of motion, difficulty with gait, muscle pain or joint pain and swelling.  Skin: Pt reports burns to BLE. Denies, rashes, lesions.   No other specific complaints in a complete review of systems (except as listed in HPI above).  Objective:   Physical Exam   BP 110/82  Pulse 85  Temp(Src) 97.6 F (36.4 C) (Oral)  Ht 5\' 4"  (1.626 m)  Wt 184 lb (83.462 kg)  BMI 31.57 kg/m2  SpO2 97% Wt Readings from Last 3 Encounters:  06/21/12 184 lb (83.462 kg)  01/13/12 194 lb (87.998 kg)  11/17/11 190 lb 9.6 oz (86.456 kg)    General: Appears their stated age, well developed, well nourished in NAD.  Skin: . Burns noted on BLE L>R. Cardiovascular: Normal rate and rhythm. S1,S2 noted.  No murmur, rubs or gallops noted. No JVD or BLE edema. No carotid bruits noted. Pulmonary/Chest: Normal effort and positive vesicular breath sounds. No respiratory distress. No wheezes, rales or ronchi noted.  Musculoskeletal: Normal range of motion. No signs of joint swelling. No difficulty with gait.        Assessment & Plan:   Burn of left foot, new onset:  Keep the area clean and dry eRx for Silver sulfadiazine cream Cover with a band aid  Watch for increased redness or purulent drainage

## 2013-07-10 ENCOUNTER — Telehealth: Payer: Self-pay | Admitting: Internal Medicine

## 2013-07-10 NOTE — Telephone Encounter (Signed)
Pt would [facility]:  [address]  [place], [place] [address]  [phone] Pt would like an appt before July 1 due to her insurance. Pt states she needs allergy shots asap.  Pt used to go to [facility] and got shots 2 x wk,, but doesn't want to go there anymore.

## 2013-07-10 NOTE — Telephone Encounter (Signed)
We havent seen her in 2 years   Why does she need a referral ? She could  make her own appt. I f we refer we would   do an OV .

## 2013-07-10 NOTE — Telephone Encounter (Signed)
Last seen in 2013. Please advise.  Thanks!

## 2013-07-10 NOTE — Telephone Encounter (Signed)
lmovm to cb. Pt Needs to make own apt or OV w/ dr Fabian Sharppanosh.

## 2013-07-16 ENCOUNTER — Ambulatory Visit (INDEPENDENT_AMBULATORY_CARE_PROVIDER_SITE_OTHER): Payer: BC Managed Care – PPO | Admitting: Internal Medicine

## 2013-07-16 ENCOUNTER — Encounter: Payer: Self-pay | Admitting: Internal Medicine

## 2013-07-16 VITALS — BP 128/86 | Temp 98.3°F | Ht 62.0 in | Wt 192.0 lb

## 2013-07-16 DIAGNOSIS — F17200 Nicotine dependence, unspecified, uncomplicated: Secondary | ICD-10-CM

## 2013-07-16 DIAGNOSIS — J309 Allergic rhinitis, unspecified: Secondary | ICD-10-CM

## 2013-07-16 DIAGNOSIS — Z720 Tobacco use: Secondary | ICD-10-CM

## 2013-07-16 DIAGNOSIS — J45909 Unspecified asthma, uncomplicated: Secondary | ICD-10-CM

## 2013-07-16 MED ORDER — ALBUTEROL SULFATE HFA 108 (90 BASE) MCG/ACT IN AERS: 2.0000 | INHALATION_SPRAY | Freq: Four times a day (QID) | RESPIRATORY_TRACT | Status: DC | PRN

## 2013-07-16 MED ORDER — BECLOMETHASONE DIPROPIONATE 40 MCG/ACT IN AERS: 2.0000 | INHALATION_SPRAY | Freq: Two times a day (BID) | RESPIRATORY_TRACT | Status: DC

## 2013-07-16 MED ORDER — LEVOCETIRIZINE DIHYDROCHLORIDE 5 MG PO TABS: 5.0000 mg | ORAL_TABLET | Freq: Every evening | ORAL | Status: DC

## 2013-07-16 NOTE — Patient Instructions (Signed)
Will do allergy referral q var is foir prevention   pro air rescue  And pre exercise  Stop tobacco for your lungs. Let  [place] know what the other allergy pill was .

## 2013-07-16 NOTE — Progress Notes (Signed)
Pre visit review using our clinic review tool, if applicable. No additional management support is needed unless otherwise documented below in the visit note.  Chief Complaint  Patient presents with  . Follow-up    Needs a refill of her ProAir for exercise and a referral to Asthma and Allergy.    HPI: Patient comes in today for SDA for  new problem evaluation. Last visit  Was 2 years ago   asks for allergy referral  Update has hx of  Asthma :  Not flares of sig until began  2-4 x per months since   wpworking  Out.  Personal trainer   Getting.  Ou t of inhalers . gests sx   Right away with exercise . Little cough   Was on q var.  As needed. Not recenetly  . No nocturnal sx  Tobacco less than 10- per day. Has information about cessation from gyne   Hx of allergy care : Was tested  In past and allergy to a lot of things  Was  On shots 2 x per week  And stopped  In about a year. Ago allergies some better but needs meds  Says allegra Claritin and zyrtec not helpful ROS: See pertinent positives and negatives per HPI.No cp syncope trying to lose weight  Has poss pcos insulin resistance  And on metformin  Iinsurance is changing  Tomorrow and needs updated rx and referral  Works [employer] FT days  Loves at home  Parents used to smoke   Past Medical History  Diagnosis Date  . Asthma   . Abnormal finding on Pap smear, ASCUS     with neg hpv  . Personal history of amenorrhea     oligo  . Chest pain     eval in er in July  . Pilonidal cyst   . Pilonidal cyst with abscess, I&D 03/24/2011. 03/24/2011  . Cholelithiasis without obstruction 10/11/2010    No family history on file.  History   Social History  . Marital Status: Single    Spouse Name: N/A    Number of Children: N/A  . Years of Education: N/A   Social History Main Topics  . Smoking status: Current Every Day Smoker    Types: Cigarettes  . Smokeless tobacco: Never Used     Comment: less than 10 a day   . Alcohol Use: Yes   Comment: social  . Drug Use: No  . Sexual Activity: None   Other Topics Concern  . 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 albuterol (PROAIR HFA) 108 (90 BASE) MCG/ACT inhaler Inhale 2 puffs into the lungs every 6 (six) hours as needed.  . desogestrel-ethinyl estradiol (APRI,EMOQUETTE,SOLIA) 0.15-30 MG-MCG tablet Take 1 tablet by mouth daily.  . fluticasone (FLONASE) 50 MCG/ACT nasal spray Place 2 sprays into the nose daily.  Marland Kitchen. levocetirizine (XYZAL) 5 MG tablet Take 1 tablet (5 mg total) by mouth every evening.  . metFORMIN (GLUCOPHAGE) 500 MG tablet Take 500 mg by mouth 2 (two) times daily with a meal.  . Norgestimate-Ethinyl Estradiol Triphasic (TRI-SPRINTEC) 0.18/0.215/0.25 MG-35 MCG tablet Take 1 tablet by mouth daily.  . Olopatadine HCl (PATANASE) 0.6 % SOLN Place into the nose.  . spironolactone (ALDACTONE) 100 MG tablet Take 100 mg by  mouth daily.  . [DISCONTINUED] albuterol (PROAIR HFA) 108 (90 BASE) MCG/ACT inhaler Inhale 2 puffs into the lungs every 6 (six) hours as needed.    . [DISCONTINUED] LEVOCETIRIZINE DIHYDROCHLORIDE PO Take by mouth.  . beclomethasone (QVAR) 40 MCG/ACT inhaler Inhale 2 puffs into the lungs 2 (two) times daily.  . [DISCONTINUED] beclomethasone (QVAR) 80 MCG/ACT inhaler Inhale 1 puff into the lungs 2 (two) times daily.  . [DISCONTINUED] silver sulfADIAZINE (SILVADENE) 1 % cream Apply topically daily.    EXAM:  BP 128/86  Temp(Src) 98.3 F (36.8 C) (Oral)  Ht 5\' 2"  (1.575 m)  Wt 192 lb (87.091 kg)  BMI 35.11 kg/m2  Body mass index is 35.11 kg/(m^2).  GENERAL: vitals reviewed and listed above, alert, oriented, appears well hydrated and in no acute distress HEENT: atraumatic, conjunctiva  clear, no obvious abnormalities on inspection of external nose and ears OP : no lesion edema or exudate  NECK: no  obvious masses on inspection palpation  LUNGS: clear to auscultation bilaterally, no wheezes, rales or rhonchi, CV: HRRR, no clubbing cyanosis or  peripheral edema nl cap refill  MS: moves all extremities without noticeable focal  abnormality PSYCH: pleasant and cooperative, slightly flat affect.  Cognition intact   ASSESSMENT AND PLAN:  Discussed the following assessment and plan:  ASTHMA - out of meds transition insurance hx of allergy shots pre exercise med controller med - Plan: Ambulatory referral to Allergy  Allergic rhinitis, cause unspecified - Plan: Ambulatory referral to Allergy  Tobacco use - 10 per day Counseled. About all of above and risk bnefot of meds and fu if inc need for  Rescue   Stopping smoking most important . Referral to be placed Total visit 25mins > 50% spent counseling and coordinating care     -Patient advised to return or notify health care team  if symptoms worsen ,persist or new concerns arise.  Patient Instructions  Will do allergy referral q var is foir prevention   pro air rescue  And pre exercise  Stop tobacco for your lungs. Let  [place] know what the other allergy pill was .       Neta MendsWanda K. Panosh M.D.

## 2013-07-17 ENCOUNTER — Encounter: Payer: Self-pay | Admitting: Internal Medicine

## 2013-08-29 ENCOUNTER — Encounter: Payer: Self-pay | Admitting: Internal Medicine

## 2013-09-30 ENCOUNTER — Encounter (HOSPITAL_COMMUNITY): Payer: Self-pay | Admitting: Emergency Medicine

## 2013-09-30 ENCOUNTER — Emergency Department (HOSPITAL_COMMUNITY)
Admission: EM | Admit: 2013-09-30 | Discharge: 2013-10-01 | Disposition: A | Discharge status: Home / Self Care | Payer: BC Managed Care – PPO | Attending: Emergency Medicine | Admitting: Emergency Medicine

## 2013-09-30 DIAGNOSIS — Z79899 Other long term (current) drug therapy: Secondary | ICD-10-CM | POA: Insufficient documentation

## 2013-09-30 DIAGNOSIS — Z872 Personal history of diseases of the skin and subcutaneous tissue: Secondary | ICD-10-CM | POA: Insufficient documentation

## 2013-09-30 DIAGNOSIS — R109 Unspecified abdominal pain: Secondary | ICD-10-CM | POA: Insufficient documentation

## 2013-09-30 DIAGNOSIS — F17200 Nicotine dependence, unspecified, uncomplicated: Secondary | ICD-10-CM | POA: Insufficient documentation

## 2013-09-30 DIAGNOSIS — N390 Urinary tract infection, site not specified: Secondary | ICD-10-CM

## 2013-09-30 DIAGNOSIS — M546 Pain in thoracic spine: Secondary | ICD-10-CM | POA: Insufficient documentation

## 2013-09-30 DIAGNOSIS — Z792 Long term (current) use of antibiotics: Secondary | ICD-10-CM | POA: Insufficient documentation

## 2013-09-30 DIAGNOSIS — Z8719 Personal history of other diseases of the digestive system: Secondary | ICD-10-CM | POA: Insufficient documentation

## 2013-09-30 DIAGNOSIS — IMO0002 Reserved for concepts with insufficient information to code with codable children: Secondary | ICD-10-CM | POA: Insufficient documentation

## 2013-09-30 DIAGNOSIS — J45909 Unspecified asthma, uncomplicated: Secondary | ICD-10-CM | POA: Insufficient documentation

## 2013-09-30 LAB — BASIC METABOLIC PANEL
Anion gap: 14 (ref 5–15)
BUN: 12 mg/dL (ref 6–23)
CO2: 24 meq/L (ref 19–32)
Calcium: 9.5 mg/dL (ref 8.4–10.5)
Chloride: 102 mEq/L (ref 96–112)
Creatinine, Ser: 0.99 mg/dL (ref 0.50–1.10)
GFR calc Af Amer: 90 mL/min — ABNORMAL LOW (ref >90)
GFR, EST NON AFRICAN AMERICAN: 77 mL/min — AB (ref >90)
GLUCOSE: 108 mg/dL — AB (ref 70–99)
POTASSIUM: 4 meq/L (ref 3.7–5.3)
SODIUM: 140 meq/L (ref 137–147)

## 2013-09-30 LAB — CBC WITH DIFFERENTIAL/PLATELET
Basophils Absolute: 0.1 10*3/uL (ref 0.0–0.1)
Basophils Relative: 1 % (ref 0–1)
Eosinophils Absolute: 0.5 10*3/uL (ref 0.0–0.7)
Eosinophils Relative: 4 % (ref 0–5)
HCT: 43.1 % (ref 36.0–46.0)
Hemoglobin: 15 g/dL (ref 12.0–15.0)
LYMPHS ABS: 2.9 10*3/uL (ref 0.7–4.0)
LYMPHS PCT: 22 % (ref 12–46)
MCH: 31.4 pg (ref 26.0–34.0)
MCHC: 34.8 g/dL (ref 30.0–36.0)
MCV: 90.2 fL (ref 78.0–100.0)
Monocytes Absolute: 0.7 10*3/uL (ref 0.1–1.0)
Monocytes Relative: 6 % (ref 3–12)
NEUTROS PCT: 69 % (ref 43–77)
Neutro Abs: 9.1 10*3/uL — ABNORMAL HIGH (ref 1.7–7.7)
PLATELETS: 353 10*3/uL (ref 150–400)
RBC: 4.78 MIL/uL (ref 3.87–5.11)
RDW: 12.5 % (ref 11.5–15.5)
WBC: 13.2 10*3/uL — AB (ref 4.0–10.5)

## 2013-09-30 LAB — URINALYSIS, ROUTINE W REFLEX MICROSCOPIC
GLUCOSE, UA: NEGATIVE mg/dL
KETONES UR: 15 mg/dL — AB
Nitrite: POSITIVE — AB
PH: 6 (ref 5.0–8.0)
Protein, ur: 30 mg/dL — AB
Specific Gravity, Urine: 1.029 (ref 1.005–1.030)
Urobilinogen, UA: 0.2 mg/dL (ref 0.0–1.0)

## 2013-09-30 LAB — URINE MICROSCOPIC-ADD ON

## 2013-09-30 LAB — PREGNANCY, URINE: Preg Test, Ur: NEGATIVE

## 2013-09-30 MED ORDER — LIDOCAINE HCL (PF) 1 % IJ SOLN
1.2000 mL | Freq: Once | INTRAMUSCULAR | Status: AC
  Administered 2013-09-30: 1.2 mL
  Filled 2013-09-30: qty 5

## 2013-09-30 MED ORDER — CEPHALEXIN 500 MG PO CAPS: 500.0000 mg | ORAL_CAPSULE | Freq: Two times a day (BID) | ORAL | Status: DC

## 2013-09-30 MED ORDER — CEFTRIAXONE SODIUM 1 G IJ SOLR
1.0000 g | Freq: Once | INTRAMUSCULAR | Status: AC
  Administered 2013-09-30: 1 g via INTRAMUSCULAR
  Filled 2013-09-30: qty 10

## 2013-09-30 MED ORDER — PHENAZOPYRIDINE HCL 200 MG PO TABS
200.0000 mg | ORAL_TABLET | Freq: Three times a day (TID) | ORAL | Status: DC
Start: 2013-09-30 — End: 2014-09-22

## 2013-09-30 NOTE — Discharge Instructions (Signed)
Take Keflex as prescribed. Do not stop antibiotics early. Recommend Pyridium as prescribed for pain control as needed; you may also take Ibuprofen for pain. Follow up with your primary care doctor to ensure resolution of symptoms. Return as needed if symptoms worsen.  Urinary Tract Infection Urinary tract infections (UTIs) can develop anywhere along your urinary tract. Your urinary tract is your body's drainage system for removing wastes and extra water. Your urinary tract includes two kidneys, two ureters, a bladder, and a urethra. Your kidneys are a pair of bean-shaped organs. Each kidney is about the size of your fist. They are located below your ribs, one on each side of your spine. CAUSES Infections are caused by microbes, which are microscopic organisms, including fungi, viruses, and bacteria. These organisms are so small that they can only be seen through a microscope. Bacteria are the microbes that most commonly cause UTIs. SYMPTOMS  Symptoms of UTIs may vary by age and gender of the patient and by the location of the infection. Symptoms in young women typically include a frequent and intense urge to urinate and a painful, burning feeling in the bladder or urethra during urination. Older women and men are more likely to be tired, shaky, and weak and have muscle aches and abdominal pain. A fever may mean the infection is in your kidneys. Other symptoms of a kidney infection include pain in your back or sides below the ribs, nausea, and vomiting. DIAGNOSIS To diagnose a UTI, your caregiver will ask you about your symptoms. Your caregiver also will ask to provide a urine sample. The urine sample will be tested for bacteria and white blood cells. White blood cells are made by your body to help fight infection. TREATMENT  Typically, UTIs can be treated with medication. Because most UTIs are caused by a bacterial infection, they usually can be treated with the use of antibiotics. The choice of  antibiotic and length of treatment depend on your symptoms and the type of bacteria causing your infection. HOME CARE INSTRUCTIONS  If you were prescribed antibiotics, take them exactly as your caregiver instructs you. Finish the medication even if you feel better after you have only taken some of the medication.  Drink enough water and fluids to keep your urine clear or pale yellow.  Avoid caffeine, tea, and carbonated beverages. They tend to irritate your bladder.  Empty your bladder often. Avoid holding urine for long periods of time.  Empty your bladder before and after sexual intercourse.  After a bowel movement, women should cleanse from front to back. Use each tissue only once. SEEK MEDICAL CARE IF:   You have back pain.  You develop a fever.  Your symptoms do not begin to resolve within 3 days. SEEK IMMEDIATE MEDICAL CARE IF:   You have severe back pain or lower abdominal pain.  You develop chills.  You have nausea or vomiting.  You have continued burning or discomfort with urination. MAKE SURE YOU:   Understand these instructions.  Will watch your condition.  Will get help right away if you are not doing well or get worse. Document Released: 10/13/2004 Document Revised: 07/05/2011 Document Reviewed: 02/11/2011 [facility] Patient Information 2015 [place], [place]. This information is not intended to replace advice given to you by your health care provider. Make sure you discuss any questions you have with your health care provider.

## 2013-09-30 NOTE — ED Provider Notes (Signed)
CSN: [id number]     Arrival date & time 09/30/13  2034 History   First MD Initiated Contact with Patient 09/30/13 2308     Chief Complaint  Patient presents with  . Urinary Tract Infection    (Consider location/radiation/quality/duration/timing/severity/associated sxs/prior Treatment) HPI Comments: 28 year old female presents to the emergency department for a pressure sensation in her suprapubic region. Patient endorses discomfort being sharp in nature at times. Symptoms associated with urinary frequency and urgency. Patient has tried increasing her oral fluid intake with no improvement in symptoms. No medications taken prior to arrival. Patient denies fever, nausea, vomiting, dysuria, hematuria, back pain, vaginal bleeding or discharge, and syncope.  Patient is a 28 y.o. female presenting with urinary tract infection. The history is provided by the patient. No language interpreter was used.  Urinary Tract Infection This is a new problem. The current episode started today. The problem occurs constantly. The problem has been unchanged. Associated symptoms include abdominal pain (suprapubic) and urinary symptoms (frequency and urgency). Pertinent negatives include no change in bowel habit, chills, diaphoresis, fever, nausea or vomiting. Nothing aggravates the symptoms. Treatments tried: increased water intake PO. The treatment provided no relief.    Past Medical History  Diagnosis Date  . Asthma   . Abnormal finding on Pap smear, ASCUS     with neg hpv  . Personal history of amenorrhea     oligo  . Chest pain     eval in er in July  . Pilonidal cyst   . Pilonidal cyst with abscess, I&D 03/24/2011. 03/24/2011  . Cholelithiasis without obstruction 10/11/2010   Past Surgical History  Procedure Laterality Date  . Cholecystectomy  2012  . Pilonidal cyst drainage  2013   No family history on file. History  Substance Use Topics  . Smoking status: Current Every Day Smoker    Types: Cigarettes   . Smokeless tobacco: Never Used     Comment: less than 10 a day   . Alcohol Use: Yes     Comment: social   OB History   Grav Para Term Preterm Abortions TAB SAB Ect Mult Living                  Review of Systems  Constitutional: Negative for fever, chills and diaphoresis.  Gastrointestinal: Positive for abdominal pain (suprapubic). Negative for nausea, vomiting, diarrhea and change in bowel habit.  Genitourinary: Positive for urgency and frequency. Negative for dysuria, hematuria, decreased urine volume, vaginal bleeding and vaginal discharge.  All other systems reviewed and are negative.   Allergies  Codeine  Home Medications   Prior to Admission medications   Medication Sig Start Date End Date Taking? Authorizing Provider  albuterol (PROAIR HFA) 108 (90 BASE) MCG/ACT inhaler Inhale 2 puffs into the lungs every 6 (six) hours as needed. 07/16/13   Madelin Headings, MD  beclomethasone (QVAR) 40 MCG/ACT inhaler Inhale 2 puffs into the lungs 2 (two) times daily. 07/16/13   Madelin Headings, MD  cephALEXin (KEFLEX) 500 MG capsule Take 1 capsule (500 mg total) by mouth 2 (two) times daily. 09/30/13   Antony Madura, PA-C  desogestrel-ethinyl estradiol (APRI,EMOQUETTE,SOLIA) 0.15-30 MG-MCG tablet Take 1 tablet by mouth daily.    Historical Provider, MD  fluticasone (FLONASE) 50 MCG/ACT nasal spray Place 2 sprays into the nose daily. 01/13/12   Terressa Koyanagi, DO  levocetirizine (XYZAL) 5 MG tablet Take 1 tablet (5 mg total) by mouth every evening. 07/16/13   Madelin Headings, MD  metFORMIN (GLUCOPHAGE) 500 MG tablet Take 500 mg by mouth 2 (two) times daily with a meal.    Historical Provider, MD  Norgestimate-Ethinyl Estradiol Triphasic (TRI-SPRINTEC) 0.18/0.215/0.25 MG-35 MCG tablet Take 1 tablet by mouth daily.    Historical Provider, MD  Olopatadine HCl (PATANASE) 0.6 % SOLN Place into the nose.    Historical Provider, MD  phenazopyridine (PYRIDIUM) 200 MG tablet Take 1 tablet (200 mg total) by  mouth 3 (three) times daily. 09/30/13   Antony Madura, PA-C  spironolactone (ALDACTONE) 100 MG tablet Take 100 mg by mouth daily.    Historical Provider, MD   BP 122/80  Pulse 99  Temp(Src) 97.4 F (36.3 C) (Oral)  Resp 26  Ht  (1.626 m)  Wt 189 lb (85.73 kg)  BMI 32.43 kg/m2  SpO2 97%  LMP 08/30/2013  Physical Exam  Nursing note and vitals reviewed. Constitutional: She is oriented to person, place, and time. She appears well-developed and well-nourished. No distress.  Nontoxic/nonseptic appearing  HENT:  Head: Normocephalic and atraumatic.  Eyes: Conjunctivae and EOM are normal. No scleral icterus.  Neck: Normal range of motion.  Cardiovascular: Normal rate, regular rhythm and intact distal pulses.   Distal radial pulse 2+ in RUE. Patient not tachycardic as noted in triage; HR 92 bpm.  Pulmonary/Chest: Effort normal. No respiratory distress.  Chest expansion symmetric.  Abdominal: Soft. She exhibits no distension. There is tenderness. There is no rebound and no guarding.  Mild suprapubic TTP. No peritoneal signs or guarding. No CVA TTP b/l.  Musculoskeletal: Normal range of motion.  Neurological: She is alert and oriented to person, place, and time. She exhibits normal muscle tone. Coordination normal.  GCS 15. Patient moves extremities without ataxia.  Skin: Skin is warm and dry. No rash noted. She is not diaphoretic. No erythema. No pallor.  Psychiatric: She has a normal mood and affect. Her behavior is normal.    ED Course  Procedures (including critical care time) Labs Review Labs Reviewed  URINALYSIS, ROUTINE W REFLEX MICROSCOPIC - Abnormal; Notable for the following:    Color, Urine AMBER (*)    APPearance CLOUDY (*)    Hgb urine dipstick LARGE (*)    Bilirubin Urine SMALL (*)    Ketones, ur 15 (*)    Protein, ur 30 (*)    Nitrite POSITIVE (*)    Leukocytes, UA LARGE (*)    All other components within normal limits  CBC WITH DIFFERENTIAL - Abnormal; Notable  for the following:    WBC 13.2 (*)    Neutro Abs 9.1 (*)    All other components within normal limits  BASIC METABOLIC PANEL - Abnormal; Notable for the following:    Glucose, Bld 108 (*)    GFR calc non Af Amer 77 (*)    GFR calc Af Amer 90 (*)    All other components within normal limits  URINE MICROSCOPIC-ADD ON - Abnormal; Notable for the following:    Squamous Epithelial / LPF FEW (*)    Bacteria, UA MANY (*)    All other components within normal limits  URINE CULTURE  PREGNANCY, URINE    Imaging Review No results found.   EKG Interpretation None      MDM   Final diagnoses:  UTI (lower urinary tract infection)    Pt has been diagnosed with a UTI. Pt is afebrile, no CVA tenderness, normotensive, and denies N/V. Leukocytosis c/w diagnosis of UTI. Tx initiated in ED with IM Rocephin given diabetes  history. Pt to be discharged home with Keflex BID x 10 days and instructions to follow up with PCP if symptoms persist. Have prescribed Pyridium for pain control. Return precautions discussed and provided. Patient agreeable to plan with no unaddressed concerns.   Filed Vitals:   09/30/13 2040 09/30/13 2310 09/30/13 2314  BP: 141/97  122/80  Pulse: 102  99  Temp: 98.1 F (36.7 C) 97.4 F (36.3 C)   TempSrc:  Oral   Resp: 16  26  Height:  (1.626 m)    Weight: 189 lb (85.73 kg)    SpO2: 98%  97%     Antony Madura, PA-C 09/30/13 2336

## 2013-09-30 NOTE — ED Notes (Signed)
The pt is c/o a pressure sensation in her lower abd for 2 days.  Urinary frequency.  No blood .  She is a diabetic.  lmp last month

## 2013-09-30 NOTE — ED Notes (Signed)
Patient states that she is unable to give urine sample at this time. 

## 2013-09-30 NOTE — ED Notes (Signed)
Pt monitored by pulse ox, bp cuff, and 5-lead. PT refused gown.

## 2013-10-01 NOTE — ED Provider Notes (Signed)
Medical screening examination/treatment/procedure(s) were performed by non-physician practitioner and as supervising physician I was immediately available for consultation/collaboration.   EKG Interpretation None        Tomasita Crumble, MD 10/01/13 2048

## 2013-10-03 LAB — URINE CULTURE: Colony Count: >=100000

## 2013-10-03 IMAGING — US US ABDOMEN COMPLETE
1 series · 14 of 25 positions shown · non-contrast
Comparison: 10/07/2010

CLINICAL DATA: Cholecystitis, preop.

COMPLETE ABDOMINAL ULTRASOUND

[Series 1: us abdomen complete · 0.32mm/px · 14 of 130 slices shown]
[im 1/130]
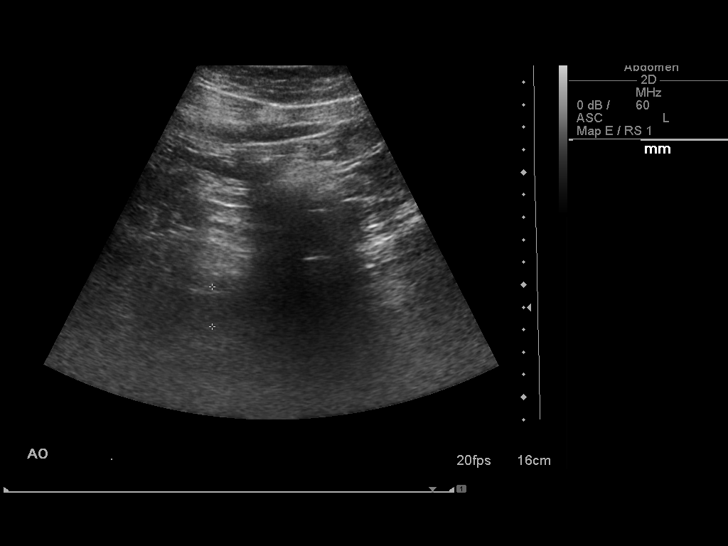
[im 11/130]
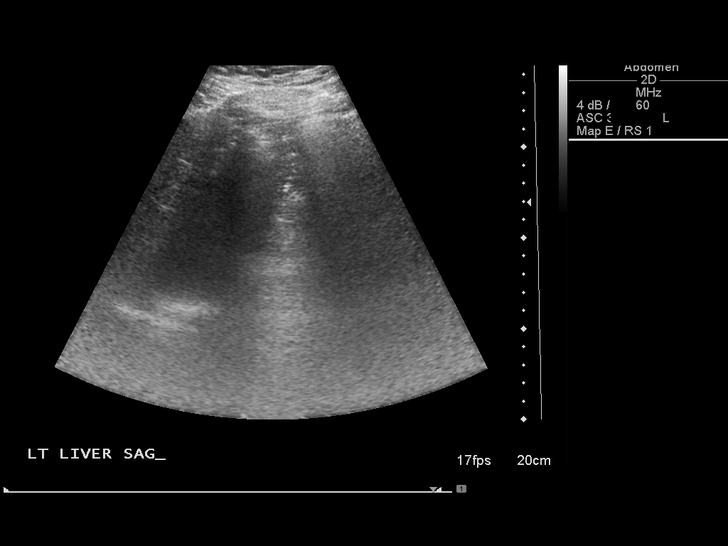
[im 22/130]
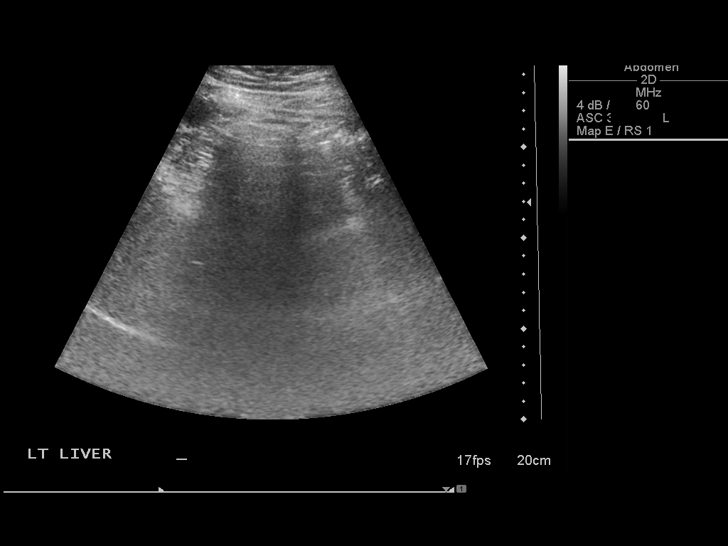
[im 33/130]
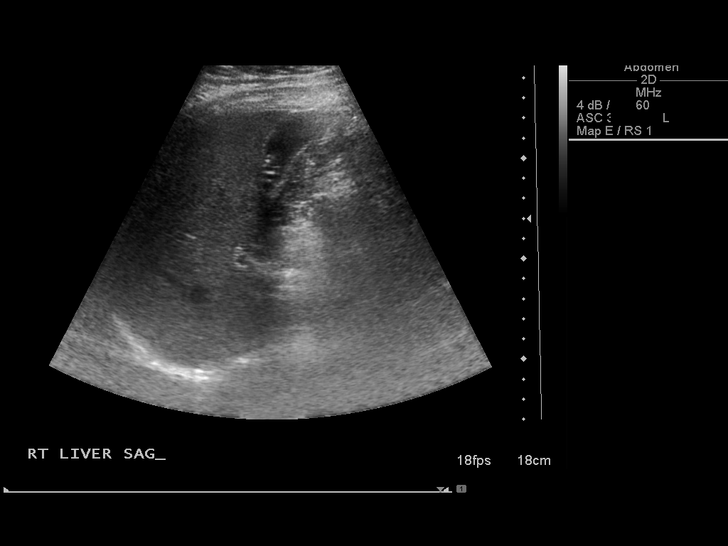
[im 44/130]
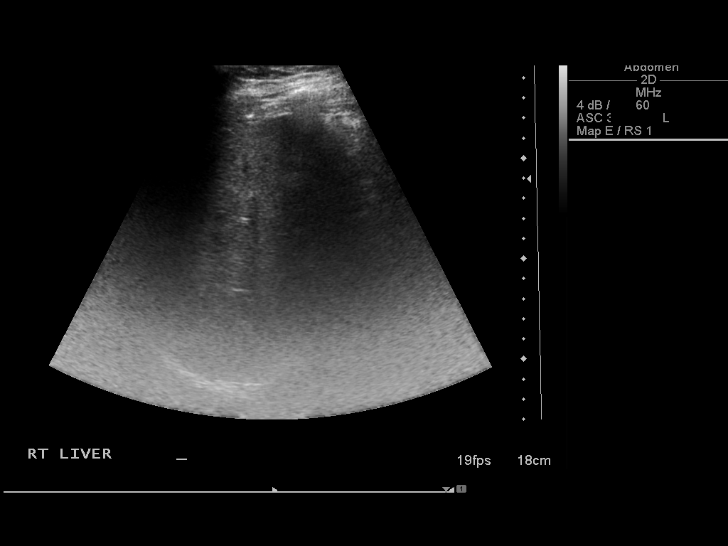
[im 49/130]
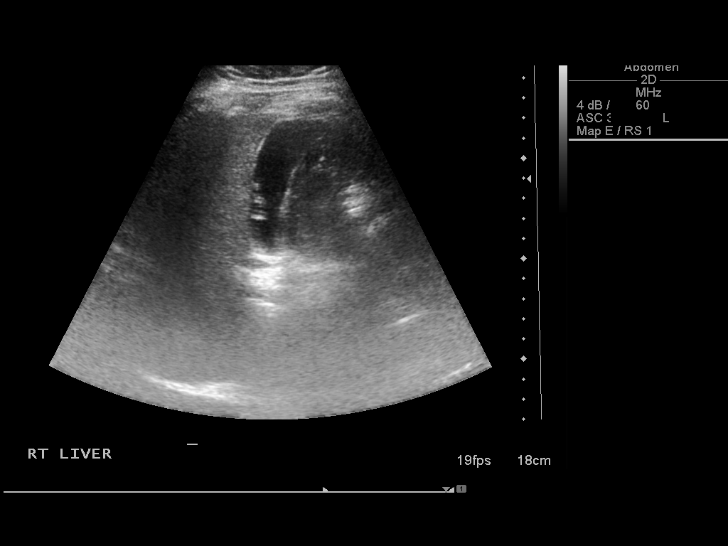
[im 60/130]
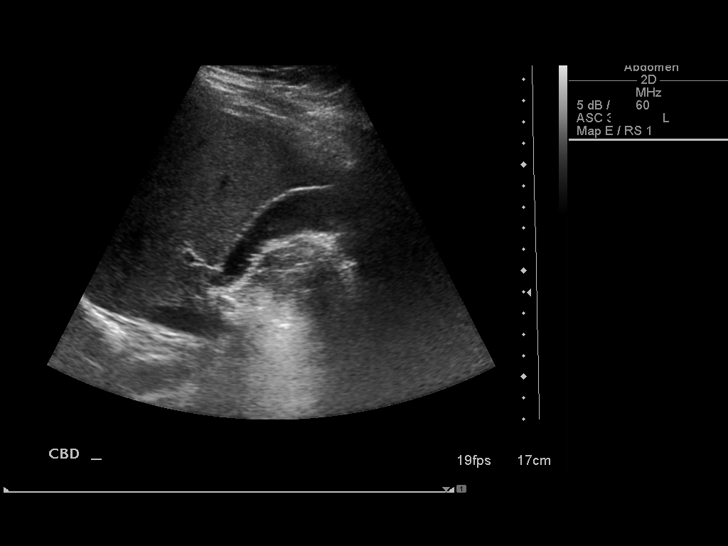
[im 70/130]
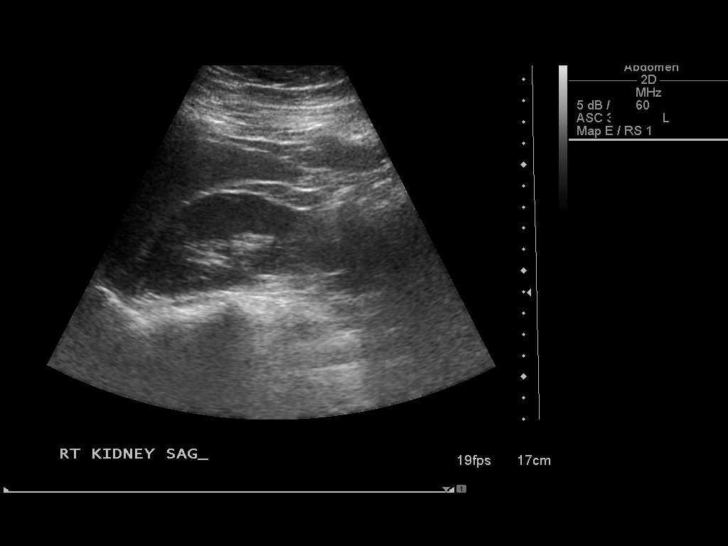
[im 81/130]
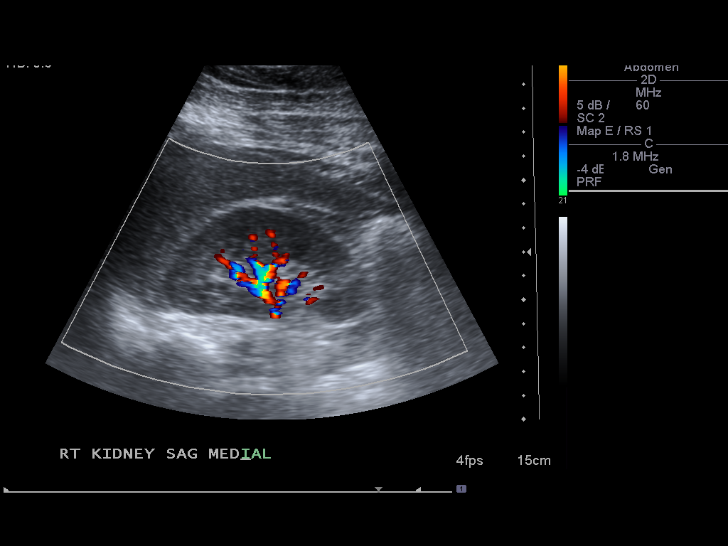
[im 87/130]
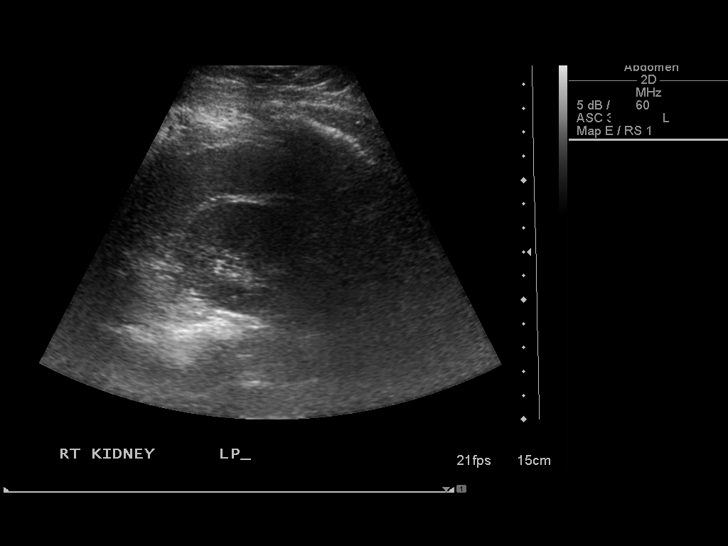
[im 97/130]
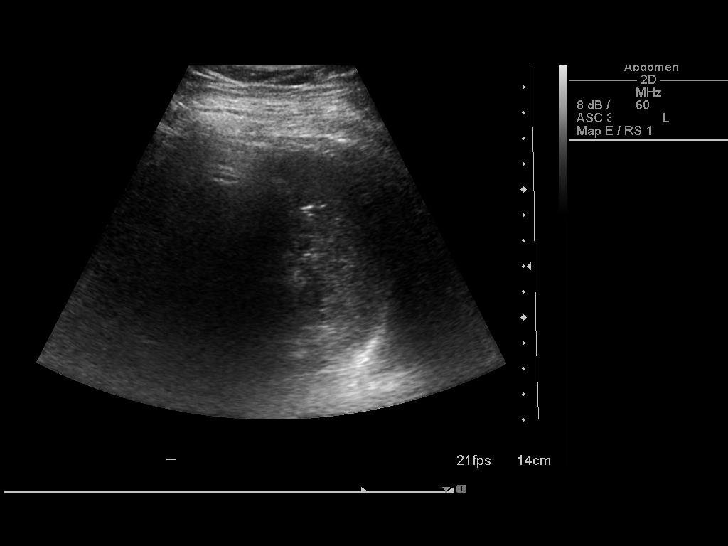
[im 108/130]
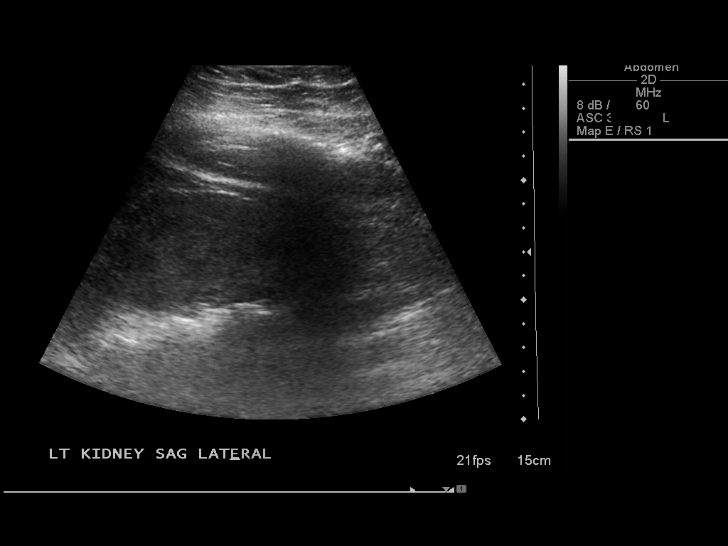
[im 119/130]
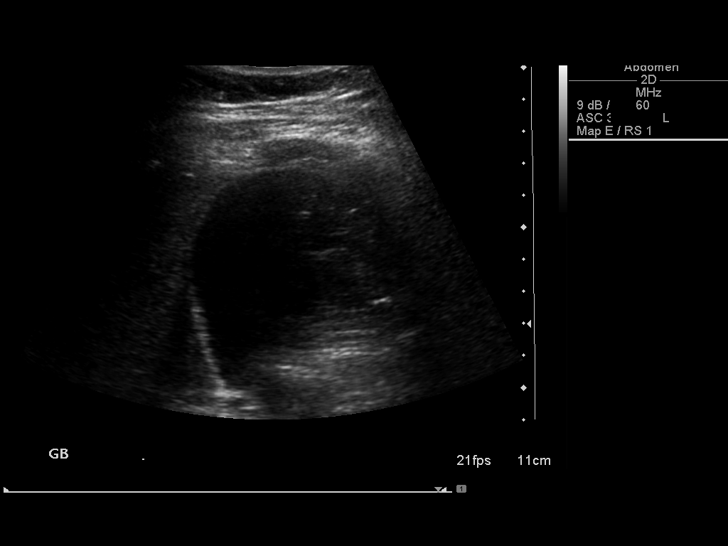
[im 130/130]
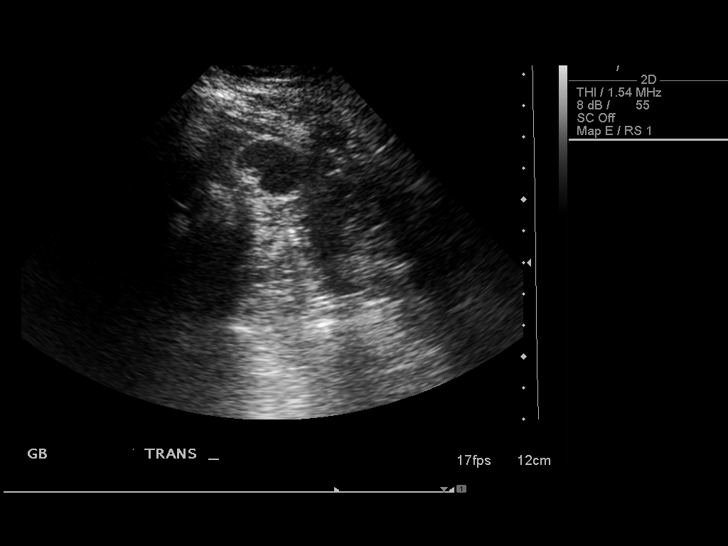

[14 of 25 positions shown; findings below may reference images not displayed]

FINDINGS: Gallbladder:  Small gallstones seen within the gallbladder. No
gallbladder wall thickening.  No pericholecystic fluid or reported
sonographic Murphy's sign.

Common bile duct:   Normal caliber, 5 mm.

Liver:  Visualized portions unremarkable.  Portions are obscured by
bowel gas.

IVC:  Appears normal.

Pancreas:  Not well visualized due to overlying bowel gas.

Spleen:  Within normal limits in size and echotexture.

Right Kidney:   Normal in size and parenchymal echogenicity.  No
evidence of mass or hydronephrosis.

Left Kidney:  Normal in size and parenchymal echogenicity.  No
evidence of mass or hydronephrosis.

Abdominal aorta:  No aneurysm identified.
IMPRESSION: Cholelithiasis.  No sonographic evidence of acute cholecystitis.

## 2013-10-04 ENCOUNTER — Telehealth (HOSPITAL_BASED_OUTPATIENT_CLINIC_OR_DEPARTMENT_OTHER): Payer: Self-pay | Admitting: Emergency Medicine

## 2013-10-04 IMAGING — RF DG CHOLANGIOGRAM OPERATIVE
1 series · 8 of 8 positions shown · non-contrast
Comparison: Abdominal ultrasound 10/22/2010.

Fluoroscopy time of 0.5 minutes was utilized.

CLINICAL DATA: 25-year-old female with right upper quadrant pain.

INTRAOPERATIVE CHOLANGIOGRAM
TECHNIQUE: Cholangiographic images from the C-arm fluoroscopic
device were submitted for interpretation post-operatively.  Please
see the procedural report for the amount of contrast and the
fluoroscopy time utilized.

[Series 1: run · 2 acquisitions, 8 frames shown]
[im 1/2]
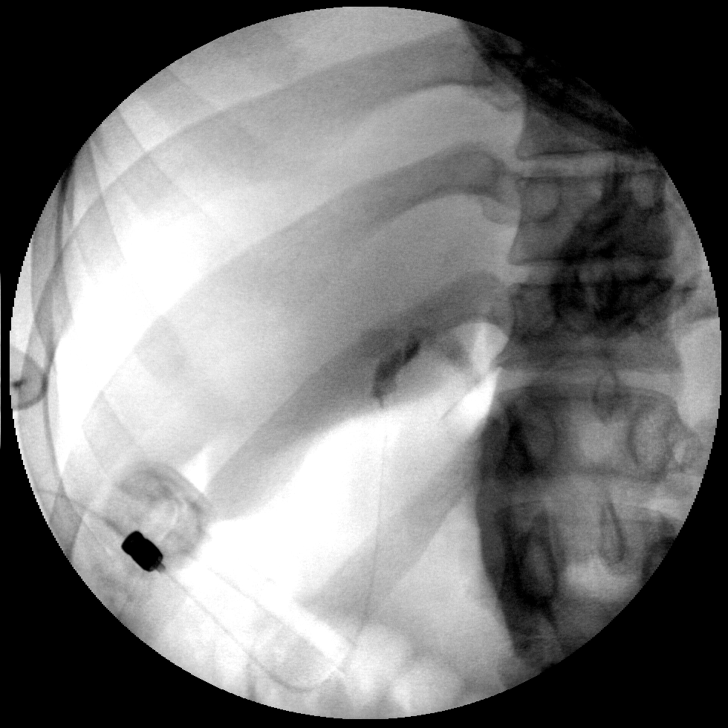
[im 1/2]
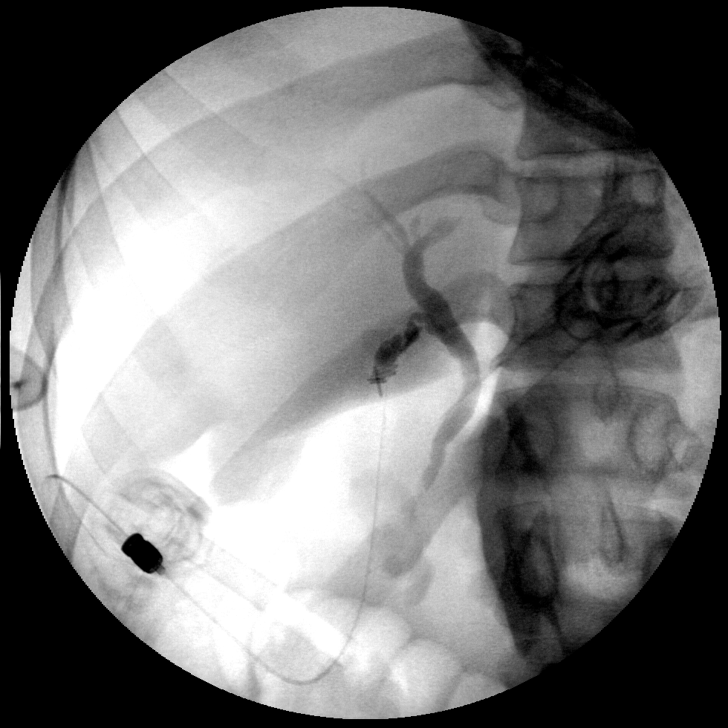
[im 1/2]
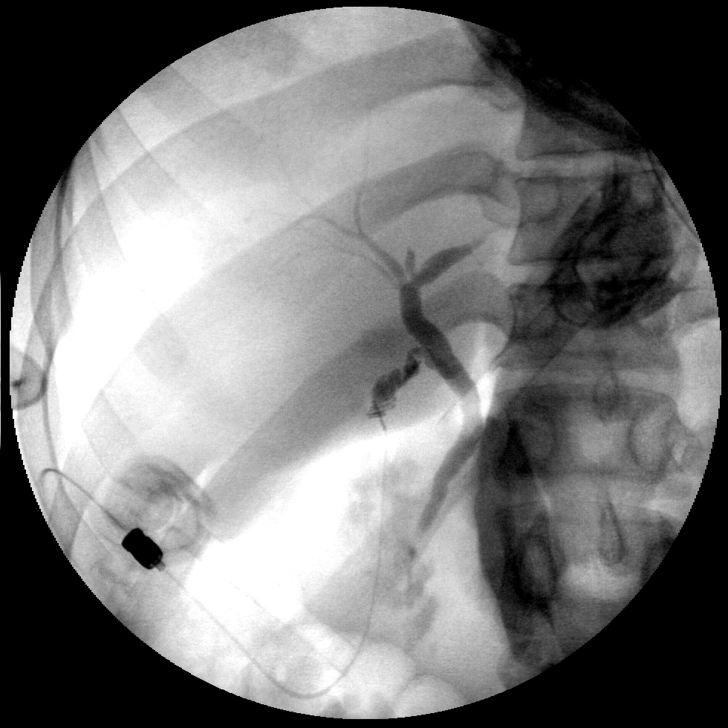
[im 1/2]
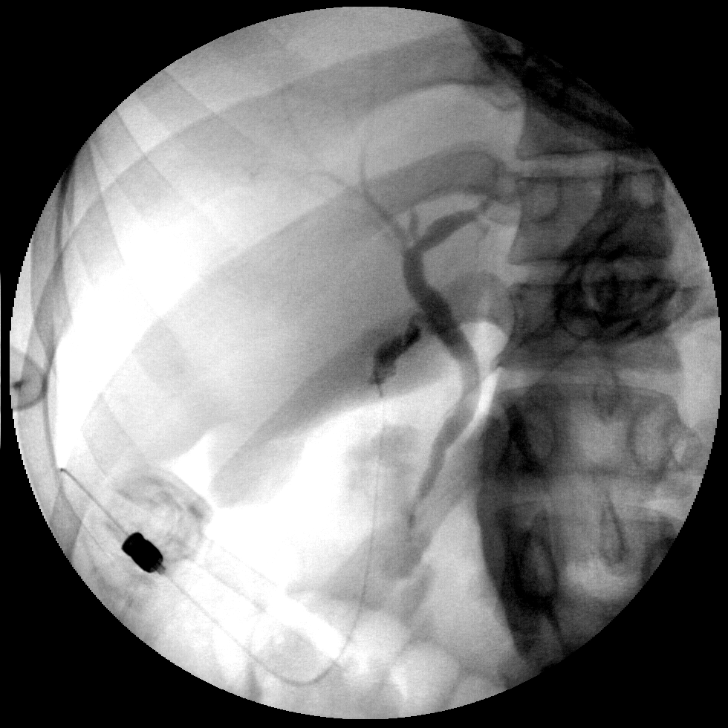
[im 2/2]
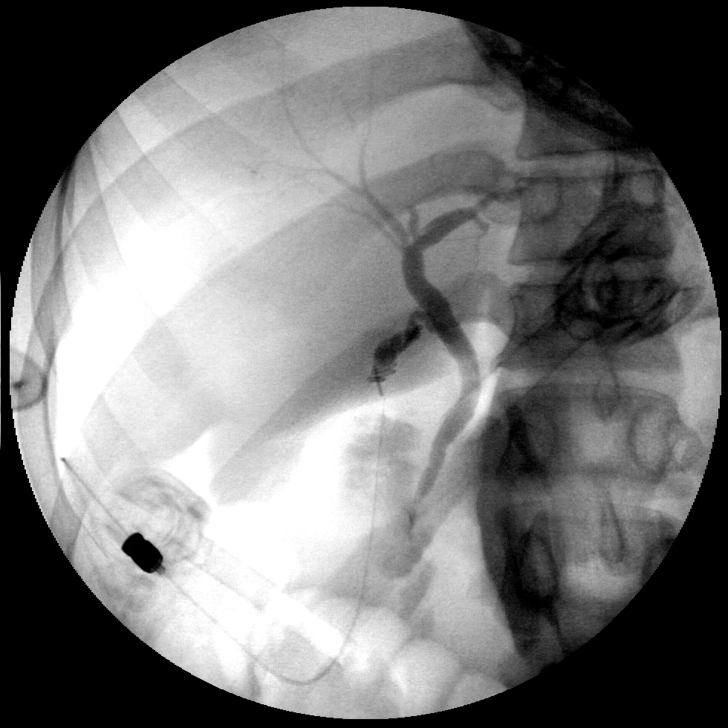
[im 2/2]
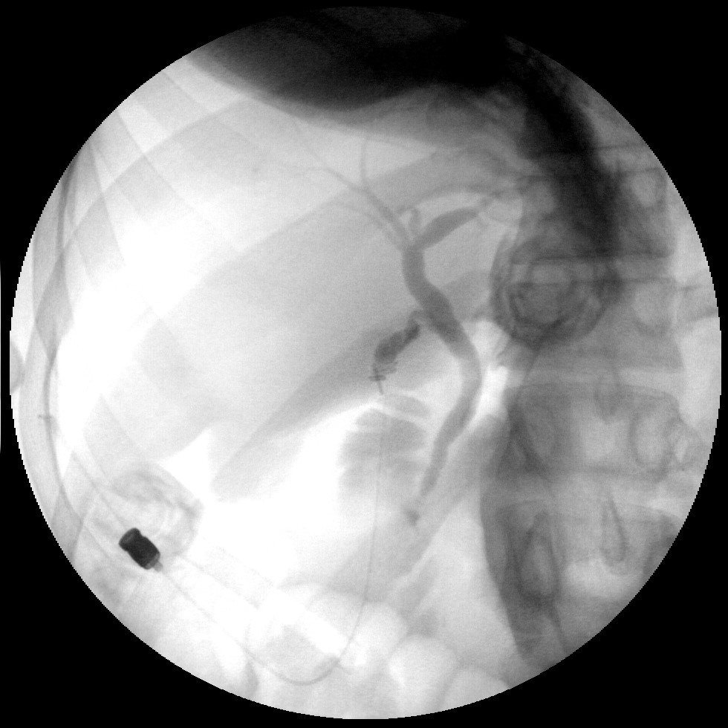
[im 2/2]
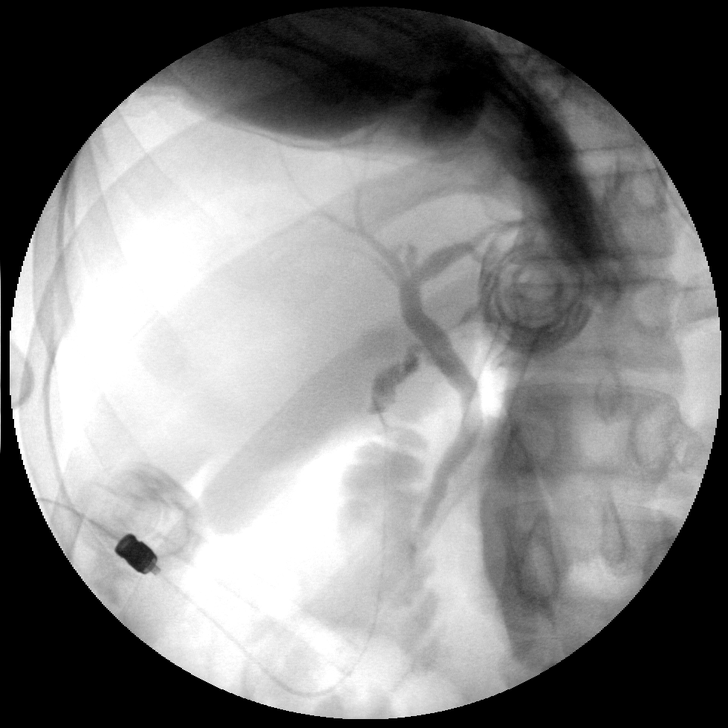
[im 2/2]
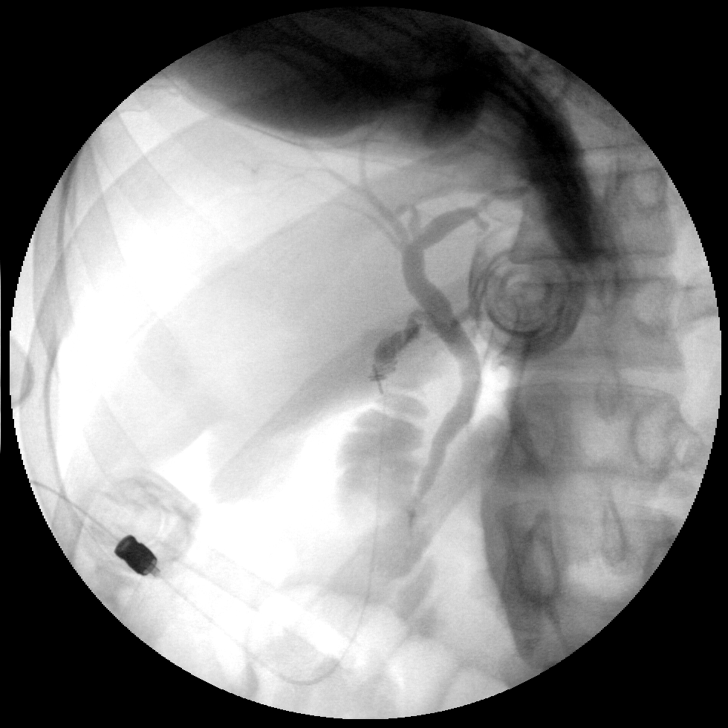

[8 of 8 positions shown; findings below may reference images not displayed]

FINDINGS: Multiple intraoperative fluoroscopic images of the right
upper quadrant.  Surgical clips at the level of the cystic duct
which is cannulated.  Contrast is injected.  Normal intra and
extrahepatic biliary tree.  Prompt emptying of contrast to the
duodenum.  No filling defect or extravasation.
IMPRESSION: Negative intraoperative cholangiogram.

## 2013-10-04 NOTE — Telephone Encounter (Signed)
Post ED Visit - Positive Culture Follow-up  Culture report reviewed by antimicrobial stewardship pharmacist:  Wes Dulaney, Pharm.D., BCPS  Celedonio Miyamoto, Pharm.D., BCPS  Georgina Pillion, [address].D., BCPS  [place], [address].D., BCPS, AAHIVP  Estella Husk, Pharm.D., BCPS, AAHIVP  Carly Sabat, Pharm.D.  Enzo Bi, [address].D.  Positive urine culture >100,000 colonies/ml Treated with Cephalexin  po caps take bid x 10 days, organism sensitive to the same and no further patient follow-up is required at this time.  Berle Mull 10/04/2013, 12:21 PM

## 2014-09-22 ENCOUNTER — Emergency Department (HOSPITAL_COMMUNITY)
Admission: EM | Admit: 2014-09-22 | Discharge: 2014-09-22 | Disposition: A | Discharge status: Home / Self Care | Payer: Self-pay | Attending: Emergency Medicine | Admitting: Emergency Medicine

## 2014-09-22 ENCOUNTER — Encounter (HOSPITAL_COMMUNITY): Payer: Self-pay | Admitting: Emergency Medicine

## 2014-09-22 DIAGNOSIS — Z8742 Personal history of other diseases of the female genital tract: Secondary | ICD-10-CM | POA: Insufficient documentation

## 2014-09-22 DIAGNOSIS — Z79899 Other long term (current) drug therapy: Secondary | ICD-10-CM | POA: Insufficient documentation

## 2014-09-22 DIAGNOSIS — E119 Type 2 diabetes mellitus without complications: Secondary | ICD-10-CM | POA: Insufficient documentation

## 2014-09-22 DIAGNOSIS — J45909 Unspecified asthma, uncomplicated: Secondary | ICD-10-CM | POA: Insufficient documentation

## 2014-09-22 DIAGNOSIS — N390 Urinary tract infection, site not specified: Secondary | ICD-10-CM | POA: Insufficient documentation

## 2014-09-22 DIAGNOSIS — M545 Low back pain: Secondary | ICD-10-CM | POA: Insufficient documentation

## 2014-09-22 DIAGNOSIS — Z720 Tobacco use: Secondary | ICD-10-CM | POA: Insufficient documentation

## 2014-09-22 DIAGNOSIS — Z9049 Acquired absence of other specified parts of digestive tract: Secondary | ICD-10-CM | POA: Insufficient documentation

## 2014-09-22 DIAGNOSIS — Z3202 Encounter for pregnancy test, result negative: Secondary | ICD-10-CM | POA: Insufficient documentation

## 2014-09-22 HISTORY — DX: Type 2 diabetes mellitus without complications: E11.9

## 2014-09-22 HISTORY — DX: Polycystic ovarian syndrome: E28.2

## 2014-09-22 LAB — URINALYSIS, ROUTINE W REFLEX MICROSCOPIC
BILIRUBIN URINE: NEGATIVE
Glucose, UA: NEGATIVE mg/dL
Ketones, ur: NEGATIVE mg/dL
Nitrite: POSITIVE — AB
PROTEIN: NEGATIVE mg/dL
Specific Gravity, Urine: 1.035 — ABNORMAL HIGH (ref 1.005–1.030)
UROBILINOGEN UA: 1 mg/dL (ref 0.0–1.0)
pH: 6.5 (ref 5.0–8.0)

## 2014-09-22 LAB — URINE MICROSCOPIC-ADD ON

## 2014-09-22 LAB — POC URINE PREG, ED: PREG TEST UR: NEGATIVE

## 2014-09-22 MED ORDER — NITROFURANTOIN MONOHYD MACRO 100 MG PO CAPS
100.0000 mg | ORAL_CAPSULE | Freq: Two times a day (BID) | ORAL | Status: DC
Start: 2014-09-22 — End: 2014-11-18

## 2014-09-22 MED ORDER — PREDNISONE 10 MG PO TABS: ORAL_TABLET | ORAL | Status: DC

## 2014-09-22 NOTE — Discharge Instructions (Signed)
Back Pain, Adult °Low back pain is very common. About 1 in 5 people have back pain. The cause of low back pain is rarely dangerous. The pain often gets better over time. About half of people with a sudden onset of back pain feel better in just 2 weeks. About 8 in 10 people feel better by 6 weeks.  °CAUSES °Some common causes of back pain include: °· Strain of the muscles or ligaments supporting the spine. °· Wear and tear (degeneration) of the spinal discs. °· Arthritis. °· Direct injury to the back. °DIAGNOSIS °Most of the time, the direct cause of low back pain is not known. However, back pain can be treated effectively even when the exact cause of the pain is unknown. Answering your caregiver's questions about your overall health and symptoms is one of the most accurate ways to make sure the cause of your pain is not dangerous. If your caregiver needs more information, he or she may order lab work or imaging tests (X-rays or MRIs). However, even if imaging tests show changes in your back, this usually does not require surgery. °HOME CARE INSTRUCTIONS °For many people, back pain returns. Since low back pain is rarely dangerous, it is often a condition that people can learn to manage on their own.  °· Remain active. It is stressful on the back to sit or stand in one place. Do not sit, drive, or stand in one place for more than 30 minutes at a time. Take short walks on level surfaces as soon as pain allows. Try to increase the length of time you walk each day. °· Do not stay in bed. Resting more than 1 or 2 days can delay your recovery. °· Do not avoid exercise or work. Your body is made to move. It is not dangerous to be active, even though your back may hurt. Your back will likely heal faster if you return to being active before your pain is gone. °· Pay attention to your body when you  bend and lift. Many people have less discomfort when lifting if they bend their knees, keep the load close to their bodies, and  avoid twisting. Often, the most comfortable positions are those that put less stress on your recovering back. °· Find a comfortable position to sleep. Use a firm mattress and lie on your side with your knees slightly bent. If you lie on your back, put a pillow under your knees. °· Only take over-the-counter or prescription medicines as directed by your caregiver. Over-the-counter medicines to reduce pain and inflammation are often the most helpful. Your caregiver may prescribe muscle relaxant drugs. These medicines help dull your pain so you can more quickly return to your normal activities and healthy exercise. °· Put ice on the injured area. °· Put ice in a plastic bag. °· Place a towel between your skin and the bag. °· Leave the ice on for 15-20 minutes, 03-04 times a day for the first 2 to 3 days. After that, ice and heat may be alternated to reduce pain and spasms. °· Ask your caregiver about trying back exercises and gentle massage. This may be of some benefit. °· Avoid feeling anxious or stressed. Stress increases muscle tension and can worsen back pain. It is important to recognize when you are anxious or stressed and learn ways to manage it. Exercise is a great option. °SEEK MEDICAL CARE IF: °· You have pain that is not relieved with rest or medicine. °· You have pain that does not improve in 1 week. °· You have new symptoms. °· You are generally not feeling well. °SEEK   IMMEDIATE MEDICAL CARE IF:  °· You have pain that radiates from your back into your legs. °· You develop new bowel or bladder control problems. °· You have unusual weakness or numbness in your arms or legs. °· You develop nausea or vomiting. °· You develop abdominal pain. °· You feel faint. °Document Released: 01/03/2005 Document Revised: 07/05/2011 Document Reviewed: 05/07/2013 °[employer]® Patient Information ©2015 [employer]. This information is not intended to replace advice given to you by your health care provider. Make sure you  discuss any questions you have with your health care provider. ° °Urinary Tract Infection °Urinary tract infections (UTIs) can develop anywhere along your urinary tract. Your urinary tract is your body's drainage system for removing wastes and extra water. Your urinary tract includes two kidneys, two ureters, a bladder, and a urethra. Your kidneys are a pair of bean-shaped organs. Each kidney is about the size of your fist. They are located below your ribs, one on each side of your spine. °CAUSES °Infections are caused by microbes, which are microscopic organisms, including fungi, viruses, and bacteria. These organisms are so small that they can only be seen through a microscope. Bacteria are the microbes that most commonly cause UTIs. °SYMPTOMS  °Symptoms of UTIs may vary by age and gender of the patient and by the location of the infection. Symptoms in young women typically include a frequent and intense urge to urinate and a painful, burning feeling in the bladder or urethra during urination. Older women and men are more likely to be tired, shaky, and weak and have muscle aches and abdominal pain. A fever may mean the infection is in your kidneys. Other symptoms of a kidney infection include pain in your back or sides below the ribs, nausea, and vomiting. °DIAGNOSIS °To diagnose a UTI, your caregiver will ask you about your symptoms. Your caregiver also will ask to provide a urine sample. The urine sample will be tested for bacteria and white blood cells. White blood cells are made by your body to help fight infection. °TREATMENT  °Typically, UTIs can be treated with medication. Because most UTIs are caused by a bacterial infection, they usually can be treated with the use of antibiotics. The choice of antibiotic and length of treatment depend on your symptoms and the type of bacteria causing your infection. °HOME CARE INSTRUCTIONS °· If you were prescribed antibiotics, take them exactly as your caregiver  instructs you. Finish the medication even if you feel better after you have only taken some of the medication. °· Drink enough water and fluids to keep your urine clear or pale yellow. °· Avoid caffeine, tea, and carbonated beverages. They tend to irritate your bladder. °· Empty your bladder often. Avoid holding urine for long periods of time. °· Empty your bladder before and after sexual intercourse. °· After a bowel movement, women should cleanse from front to back. Use each tissue only once. °SEEK MEDICAL CARE IF:  °· You have back pain. °· You develop a fever. °· Your symptoms do not begin to resolve within 3 days. °SEEK IMMEDIATE MEDICAL CARE IF:  °· You have severe back pain or lower abdominal pain. °· You develop chills. °· You have nausea or vomiting. °· You have continued burning or discomfort with urination. °MAKE SURE YOU:  °· Understand these instructions. °· Will watch your condition. °· Will get help right away if you are not doing well or get worse. °Document Released: 10/13/2004 Document Revised: 07/05/2011 Document Reviewed: 02/11/2011 °[employer]® Patient Information ©  2015 [employer]. This information is not intended to replace advice given to you by your health care provider. Make sure you discuss any questions you have with your health care provider. ° °

## 2014-09-22 NOTE — ED Provider Notes (Signed)
CSN: [id number]     Arrival date & time 09/22/14  1023 History   First MD Initiated Contact with Patient 09/22/14 1217     Chief Complaint  Patient presents with  . Flank Pain     (Consider location/radiation/quality/duration/timing/severity/associated sxs/prior Treatment) HPI   Jillian Copeland is a 29 y.o. female who presents for evaluation of right upper leg pain, for 3 months, both day and night. No known trauma. No dysuria, urinary frequency, nausea, vomiting, weakness or dizziness. She has recently started doing a job which requires prolonged walking, and this has aggravated the pain. The pain is worst when she is working and walking. No urinary incontinence, fecal incontinence or leg numbness. There are no other known modifying factors.   Past Medical History  Diagnosis Date  . Asthma   . Abnormal finding on Pap smear, ASCUS     with neg hpv  . Personal history of amenorrhea     oligo  . Chest pain     eval in er in July  . Pilonidal cyst   . Pilonidal cyst with abscess, I&D 03/24/2011. 03/24/2011  . Cholelithiasis without obstruction 10/11/2010  . Diabetes mellitus without complication   . PCOS (polycystic ovarian syndrome)    Past Surgical History  Procedure Laterality Date  . Cholecystectomy  2012  . Pilonidal cyst drainage  2013   History reviewed. No pertinent family history. Social History  Substance Use Topics  . Smoking status: Current Every Day Smoker -- 0.25 packs/day    Types: Cigarettes  . Smokeless tobacco: Never Used     Comment: less than 10 a day   . Alcohol Use: Yes     Comment: social   OB History    No data available     Review of Systems  All other systems reviewed and are negative.     Allergies  Codeine  Home Medications   Prior to Admission medications   Medication Sig Start Date End Date Taking? Authorizing Provider  albuterol (PROAIR HFA) 108 (90 BASE) MCG/ACT inhaler Inhale 2 puffs into the lungs every 6 (six) hours as  needed. Patient taking differently: Inhale 2 puffs into the lungs every 6 (six) hours as needed for wheezing or shortness of breath.  07/16/13  Yes Madelin Headings, MD  metFORMIN (GLUCOPHAGE) 500 MG tablet Take 500 mg by mouth 2 (two) times daily with a meal.   Yes Historical Provider, MD  spironolactone (ALDACTONE) 100 MG tablet Take 100 mg by mouth daily.   Yes Historical Provider, MD  beclomethasone (QVAR) 40 MCG/ACT inhaler Inhale 2 puffs into the lungs 2 (two) times daily. Patient not taking: Reported on 09/22/2014 07/16/13   Madelin Headings, MD  fluticasone [facility]) 50 MCG/ACT nasal spray Place 2 sprays into the nose daily. Patient not taking: Reported on 09/22/2014 01/13/12   Terressa Koyanagi, DO  levocetirizine (XYZAL) 5 MG tablet Take 1 tablet (5 mg total) by mouth every evening. Patient not taking: Reported on 09/22/2014 07/16/13   Madelin Headings, MD  nitrofurantoin, macrocrystal-monohydrate, (MACROBID) 100 MG capsule Take 1 capsule (100 mg total) by mouth 2 (two) times daily. X 7 days 09/22/14   Mancel Bale, MD  predniSONE (DELTASONE) 10 MG tablet Take q day 6,5,4,3,2,1 09/22/14   Mancel Bale, MD   BP 122/86 mmHg  Pulse 58  Temp(Src) 98.2 F (36.8 C) (Oral)  Resp 18  SpO2 99%  LMP 02/17/2014 (Approximate) Physical Exam  Constitutional: She is oriented to person, place,  and time. She appears well-developed and well-nourished.  HENT:  Head: Normocephalic and atraumatic.  Right Ear: External ear normal.  Left Ear: External ear normal.  Eyes: Conjunctivae and EOM are normal. Pupils are equal, round, and reactive to light.  Neck: Normal range of motion and phonation normal. Neck supple.  Cardiovascular: Normal rate, regular rhythm and normal heart sounds.   Pulmonary/Chest: Effort normal and breath sounds normal. She exhibits no bony tenderness.  Abdominal: Soft. There is no tenderness.  Musculoskeletal: Normal range of motion.  Mild left lumbar tenderness with normal range of motion. She is  able to walk with normal gait. No significant pain or tenderness with passive range of motion of the right hip.  Neurological: She is alert and oriented to person, place, and time. No cranial nerve deficit or sensory deficit. She exhibits normal muscle tone. Coordination normal.  Skin: Skin is warm, dry and intact.  Psychiatric: She has a normal mood and affect. Her behavior is normal. Judgment and thought content normal.  Nursing note and vitals reviewed.   ED Course  Procedures (including critical care time) Medications - No data to display  Patient Vitals for the past 24 hrs:  BP Temp Temp src Pulse Resp SpO2  09/22/14 1303 122/86 mmHg - - (!) 58 18 99 %  09/22/14 1028 132/98 mmHg 98.2 F (36.8 C) Oral 98 20 99 %    At discharge- she relates having some urinary frequency, and mild dysuria. She would like to be treated for a UTI. Findings discussed with the patient, all questions were answered.   Labs Review Labs Reviewed  URINALYSIS, ROUTINE W REFLEX MICROSCOPIC (NOT AT [facility]) - Abnormal; Notable for the following:    APPearance CLOUDY (*)    Specific Gravity, Urine 1.035 (*)    Hgb urine dipstick SMALL (*)    Nitrite POSITIVE (*)    Leukocytes, UA MODERATE (*)    All other components within normal limits  URINE MICROSCOPIC-ADD ON - Abnormal; Notable for the following:    Squamous Epithelial / LPF FEW (*)    Bacteria, UA MANY (*)    All other components within normal limits  POC URINE PREG, ED    Imaging Review No results found. I have personally reviewed and evaluated these images and lab results as part of my medical decision-making. No known trauma. No recent fever, chills, nausea, vomiting, weakness or dizziness   EKG Interpretation None      MDM   Final diagnoses:  Right low back pain, with sciatica presence unspecified  UTI (lower urinary tract infection)    Nonspecific low back pain and upper leg pain. Most likely related to sciatica. Incidental evidence  for urinary tract infection with patient symptomatic for urinary frequency. Doubt serous bacterial infection, spinal myelopathy or metabolic instability.  Nursing Notes Reviewed/ Care Coordinated Applicable Imaging Reviewed Interpretation of Laboratory Data incorporated into ED treatment  The patient appears reasonably screened and/or stabilized for discharge and I doubt any other medical condition or other [facility] requiring further screening, evaluation, or treatment in the ED at this time prior to discharge.  Plan: Home Medications- Prednisone, Macrobid; Home Treatments- rest, fluids; return here if the recommended treatment, does not improve the symptoms; Recommended follow up- PCP prn     Mancel Bale, MD 09/23/14 [phone]

## 2014-09-22 NOTE — ED Notes (Signed)
Patient reports right flank pain x 3 months.  Reports after starting new job the pain has been worse this weekend than it has ever been.  Reports she thinks this is due to being on her feet for long periods of time.

## 2014-11-14 ENCOUNTER — Ambulatory Visit: Payer: Self-pay | Admitting: Internal Medicine

## 2014-11-17 ENCOUNTER — Emergency Department (HOSPITAL_BASED_OUTPATIENT_CLINIC_OR_DEPARTMENT_OTHER): Payer: Managed Care, Other (non HMO)

## 2014-11-17 ENCOUNTER — Telehealth: Payer: Self-pay | Admitting: Internal Medicine

## 2014-11-17 ENCOUNTER — Encounter (HOSPITAL_BASED_OUTPATIENT_CLINIC_OR_DEPARTMENT_OTHER): Payer: Self-pay

## 2014-11-17 ENCOUNTER — Emergency Department (HOSPITAL_BASED_OUTPATIENT_CLINIC_OR_DEPARTMENT_OTHER)
Admission: EM | Admit: 2014-11-17 | Discharge: 2014-11-17 | Disposition: A | Discharge status: Home / Self Care | Payer: Managed Care, Other (non HMO) | Attending: Emergency Medicine | Admitting: Emergency Medicine

## 2014-11-17 DIAGNOSIS — Z79899 Other long term (current) drug therapy: Secondary | ICD-10-CM | POA: Insufficient documentation

## 2014-11-17 DIAGNOSIS — Z8742 Personal history of other diseases of the female genital tract: Secondary | ICD-10-CM | POA: Diagnosis not present

## 2014-11-17 DIAGNOSIS — R42 Dizziness and giddiness: Secondary | ICD-10-CM | POA: Diagnosis not present

## 2014-11-17 DIAGNOSIS — Z3202 Encounter for pregnancy test, result negative: Secondary | ICD-10-CM | POA: Insufficient documentation

## 2014-11-17 DIAGNOSIS — N390 Urinary tract infection, site not specified: Secondary | ICD-10-CM | POA: Diagnosis not present

## 2014-11-17 DIAGNOSIS — Z720 Tobacco use: Secondary | ICD-10-CM | POA: Insufficient documentation

## 2014-11-17 DIAGNOSIS — Z872 Personal history of diseases of the skin and subcutaneous tissue: Secondary | ICD-10-CM | POA: Insufficient documentation

## 2014-11-17 DIAGNOSIS — E119 Type 2 diabetes mellitus without complications: Secondary | ICD-10-CM | POA: Diagnosis not present

## 2014-11-17 DIAGNOSIS — Z7951 Long term (current) use of inhaled steroids: Secondary | ICD-10-CM | POA: Insufficient documentation

## 2014-11-17 DIAGNOSIS — J45909 Unspecified asthma, uncomplicated: Secondary | ICD-10-CM | POA: Insufficient documentation

## 2014-11-17 DIAGNOSIS — R079 Chest pain, unspecified: Secondary | ICD-10-CM | POA: Diagnosis not present

## 2014-11-17 LAB — URINE MICROSCOPIC-ADD ON

## 2014-11-17 LAB — CBC WITH DIFFERENTIAL/PLATELET
BASOS ABS: 0 10*3/uL (ref 0.0–0.1)
Basophils Relative: 0 %
Eosinophils Absolute: 0 10*3/uL (ref 0.0–0.7)
Eosinophils Relative: 0 %
HCT: 37.2 % (ref 36.0–46.0)
Hemoglobin: 12.5 g/dL (ref 12.0–15.0)
LYMPHS ABS: 5.2 10*3/uL — AB (ref 0.7–4.0)
LYMPHS PCT: 52 %
MCH: 30.3 pg (ref 26.0–34.0)
MCHC: 33.6 g/dL (ref 30.0–36.0)
MCV: 90.1 fL (ref 78.0–100.0)
Monocytes Absolute: 0.7 10*3/uL (ref 0.1–1.0)
Monocytes Relative: 7 %
Neutro Abs: 4.1 10*3/uL (ref 1.7–7.7)
Neutrophils Relative %: 41 %
PLATELETS: 417 10*3/uL — AB (ref 150–400)
RBC: 4.13 MIL/uL (ref 3.87–5.11)
RDW: 12.6 % (ref 11.5–15.5)
WBC: 10 10*3/uL (ref 4.0–10.5)

## 2014-11-17 LAB — BASIC METABOLIC PANEL
Anion gap: 5 (ref 5–15)
BUN: 13 mg/dL (ref 6–20)
CO2: 24 mmol/L (ref 22–32)
Calcium: 9 mg/dL (ref 8.9–10.3)
Chloride: 108 mmol/L (ref 101–111)
Creatinine, Ser: 0.76 mg/dL (ref 0.44–1.00)
GFR calc Af Amer: >60 mL/min (ref >60)
Glucose, Bld: 102 mg/dL — ABNORMAL HIGH (ref 65–99)
Potassium: 3.9 mmol/L (ref 3.5–5.1)
SODIUM: 137 mmol/L (ref 135–145)

## 2014-11-17 LAB — URINALYSIS, ROUTINE W REFLEX MICROSCOPIC
Bilirubin Urine: NEGATIVE
Glucose, UA: NEGATIVE mg/dL
Ketones, ur: NEGATIVE mg/dL
NITRITE: NEGATIVE
PH: 6 (ref 5.0–8.0)
Protein, ur: NEGATIVE mg/dL
SPECIFIC GRAVITY, URINE: 1.006 (ref 1.005–1.030)
UROBILINOGEN UA: 0.2 mg/dL (ref 0.0–1.0)

## 2014-11-17 LAB — CBG MONITORING, ED: GLUCOSE-CAPILLARY: 112 mg/dL — AB (ref 65–99)

## 2014-11-17 LAB — PREGNANCY, URINE: PREG TEST UR: NEGATIVE

## 2014-11-17 LAB — TROPONIN I: Troponin I: <0.03 ng/mL (ref <0.031)

## 2014-11-17 MED ORDER — ALBUTEROL SULFATE HFA 108 (90 BASE) MCG/ACT IN AERS
2.0000 | INHALATION_SPRAY | Freq: Once | RESPIRATORY_TRACT | Status: AC
  Administered 2014-11-17: 2 via RESPIRATORY_TRACT
  Filled 2014-11-17: qty 6.7

## 2014-11-17 MED ORDER — CEPHALEXIN 500 MG PO CAPS: 500.0000 mg | ORAL_CAPSULE | Freq: Two times a day (BID) | ORAL | Status: DC

## 2014-11-17 NOTE — Telephone Encounter (Signed)
Patient Name: Andrey FarmerCOURTENEY Strupp  DOB: 18-Nov-1985    Initial Comment caller states she is light headed and BP is 148/100   Nurse Assessment  Nurse: Scarlette ArStandifer, RN, Heather Date/Time (Eastern Time): 11/17/2014 10:31:50 AM  Confirm and document reason for call. If symptomatic, describe symptoms. ---caller states she is light headed, it started after she got to work and she has been like that for over an hour so far. and BP is 148/100  Has the patient traveled out of the country within the last 30 days? ---Not Applicable  Does the patient have any new or worsening symptoms? ---Yes  Will a triage be completed? ---Yes  Related visit to physician within the last 2 weeks? ---No  Does the PT have any chronic conditions? (i.e. diabetes, asthma, etc.) ---Yes  List chronic conditions. ---DM  Did the patient indicate they were pregnant? ---No     Guidelines    Guideline Title Affirmed Question Affirmed Notes  High Blood Pressure [1] BP ? 160 / 100 AND [2] cardiac or neurologic symptoms (e.g., chest pain, difficulty breathing, unsteady gait, blurred vision)    Final Disposition User   Go to ED Now Standifer, RN, Heather    Referrals  [facility] - ED   Disagree/Comply: Comply

## 2014-11-17 NOTE — ED Notes (Signed)
Pt walked back from xray

## 2014-11-17 NOTE — Telephone Encounter (Signed)
Patient checked into ED 

## 2014-11-17 NOTE — ED Provider Notes (Signed)
CSN: [id number]     Arrival date & time 11/17/14  1119 History   First MD Initiated Contact with Patient 11/17/14 1133     Chief Complaint  Patient presents with  . Dizziness    HPI   Jillian Copeland is a 29 y.o. female with a PMH of asthma, DM who presents to the ED with lightheadedness. She states she went to work this morning and felt fine, however she reports she was standing and started to feel lightheaded and like she was going to pass out. She states she sat down and her symptoms subsequently resolved. She denies fall or injury. She denies fever, chills, headache, dizziness. She reports constant right-sided chest pain, which started last night before going to bed. She denies exacerbating or alleviating factors. She has not tried anything for symptom relief. She denies shortness of breath, abdominal pain, nausea, vomiting, diarrhea, numbness, weakness, paresthesia, recent travel or immobility, recent surgery, history of malignancy, history of DVT or PE, estrogen use.   Past Medical History  Diagnosis Date  . Asthma   . Abnormal finding on Pap smear, ASCUS     with neg hpv  . Personal history of amenorrhea     oligo  . Chest pain     eval in er in July  . Pilonidal cyst   . Pilonidal cyst with abscess, I&D 03/24/2011. 03/24/2011  . Cholelithiasis without obstruction 10/11/2010  . Diabetes mellitus without complication (HCC)   . PCOS (polycystic ovarian syndrome)    Past Surgical History  Procedure Laterality Date  . Cholecystectomy  2012  . Pilonidal cyst drainage  2013   No family history on file. Social History  Substance Use Topics  . Smoking status: Current Every Day Smoker -- 0.25 packs/day    Types: Cigarettes  . Smokeless tobacco: Never Used     Comment: less than 10 a day   . Alcohol Use: Yes     Comment: social   OB History    No data available      Review of Systems  Constitutional: Negative for fever and chills.  HENT: Negative for congestion.    Respiratory: Negative for cough and shortness of breath.   Cardiovascular: Positive for chest pain. Negative for leg swelling.  Gastrointestinal: Negative for nausea, vomiting, abdominal pain, diarrhea and constipation.  Neurological: Positive for light-headedness. Negative for dizziness, syncope, weakness, numbness and headaches.  All other systems reviewed and are negative.     Allergies  Codeine  Home Medications   Prior to Admission medications   Medication Sig Start Date End Date Taking? Authorizing Provider  albuterol (PROAIR HFA) 108 (90 BASE) MCG/ACT inhaler Inhale 2 puffs into the lungs every 6 (six) hours as needed. Patient taking differently: Inhale 2 puffs into the lungs every 6 (six) hours as needed for wheezing or shortness of breath.  07/16/13   Madelin HeadingsWanda K Panosh, MD  beclomethasone (QVAR) 40 MCG/ACT inhaler Inhale 2 puffs into the lungs 2 (two) times daily. Patient not taking: Reported on 09/22/2014 07/16/13   Madelin HeadingsWanda K Panosh, MD  cephALEXin (KEFLEX) 500 MG capsule Take 1 capsule (500 mg total) by mouth 2 (two) times daily. 11/17/14   Mady GemmaElizabeth C Westfall, PA-C  fluticasone (FLONASE) 50 MCG/ACT nasal spray Place 2 sprays into the nose daily. Patient not taking: Reported on 09/22/2014 01/13/12   Terressa KoyanagiHannah R Kim, DO  levocetirizine (XYZAL) 5 MG tablet Take 1 tablet (5 mg total) by mouth every evening. Patient not taking: Reported on 09/22/2014  07/16/13   Madelin Headings, MD  metFORMIN (GLUCOPHAGE) 500 MG tablet Take 500 mg by mouth 2 (two) times daily with a meal.    Historical Provider, MD  nitrofurantoin, macrocrystal-monohydrate, (MACROBID) 100 MG capsule Take 1 capsule (100 mg total) by mouth 2 (two) times daily. X 7 days 09/22/14   Mancel Bale, MD  predniSONE (DELTASONE) 10 MG tablet Take q day 6,5,4,3,2,1 09/22/14   Mancel Bale, MD  spironolactone (ALDACTONE) 100 MG tablet Take 100 mg by mouth daily.    Historical Provider, MD    BP 123/88 mmHg  Pulse 51  Temp(Src) 97.7 F (36.5  C) (Oral)  Resp 18  Ht  (1.626 m)  Wt 203 lb (92.08 kg)  BMI 34.83 kg/m2  SpO2 100%  LMP  (LMP Unknown) Physical Exam  Constitutional: She is oriented to person, place, and time. She appears well-developed and well-nourished. No distress.  HENT:  Head: Normocephalic and atraumatic.  Right Ear: External ear normal.  Left Ear: External ear normal.  Nose: Nose normal.  Mouth/Throat: Uvula is midline, oropharynx is clear and moist and mucous membranes are normal.  Eyes: Conjunctivae, EOM and lids are normal. Pupils are equal, round, and reactive to light. Right eye exhibits no discharge. Left eye exhibits no discharge. No scleral icterus.  Neck: Normal range of motion. Neck supple.  Cardiovascular: Normal rate, regular rhythm, normal heart sounds, intact distal pulses and normal pulses.   Pulmonary/Chest: Effort normal and breath sounds normal. No respiratory distress. She has no wheezes. She has no rales. She exhibits tenderness.  Mild tenderness to palpation of right anterior chest wall.  Abdominal: Soft. Normal appearance and bowel sounds are normal. She exhibits no distension and no mass. There is no tenderness. There is no rigidity, no rebound and no guarding.  Musculoskeletal: Normal range of motion. She exhibits no edema or tenderness.  Neurological: She is alert and oriented to person, place, and time. She has normal strength. No cranial nerve deficit or sensory deficit.  Skin: Skin is warm, dry and intact. No rash noted. She is not diaphoretic. No erythema. No pallor.  Psychiatric: She has a normal mood and affect. Her speech is normal and behavior is normal.  Nursing note and vitals reviewed.   ED Course  Procedures (including critical care time)  Labs Review Labs Reviewed  URINALYSIS, ROUTINE W REFLEX MICROSCOPIC (NOT AT [facility]) - Abnormal; Notable for the following:    Hgb urine dipstick MODERATE (*)    Leukocytes, UA MODERATE (*)    All other components within normal  limits  CBC WITH DIFFERENTIAL/PLATELET - Abnormal; Notable for the following:    Platelets 417 (*)    Lymphs Abs 5.2 (*)    All other components within normal limits  BASIC METABOLIC PANEL - Abnormal; Notable for the following:    Glucose, Bld 102 (*)    All other components within normal limits  URINE MICROSCOPIC-ADD ON - Abnormal; Notable for the following:    Squamous Epithelial / LPF FEW (*)    Bacteria, UA FEW (*)    All other components within normal limits  CBG MONITORING, ED - Abnormal; Notable for the following:    Glucose-Capillary 112 (*)    All other components within normal limits  PREGNANCY, URINE  TROPONIN I    Imaging Review Dg Chest 2 View  11/17/2014  CLINICAL DATA:  29 year old female with lightheadedness and dizziness EXAM: CHEST  2 VIEW COMPARISON:  Prior chest x-ray 01/13/2012 FINDINGS: The lungs  are clear and negative for focal airspace consolidation, pulmonary edema or suspicious pulmonary nodule. No pleural effusion or pneumothorax. Cardiac and mediastinal contours are within normal limits. No acute fracture or lytic or blastic osseous lesions. The visualized upper abdominal bowel gas pattern is unremarkable. IMPRESSION: Normal chest x-ray. Electronically Signed   By: Malachy Moan M.D.   On: 11/17/2014 13:04     I have personally reviewed and evaluated these images and lab results as part of my medical decision-making.   EKG Interpretation None      MDM   Final diagnoses:  Lightheadedness  UTI (lower urinary tract infection)    29 year old female presents with lightheadedness, which occurred today while she was at work. Denies fall or injury. Denies fever, chills, headache, dizziness. Reports constant right-sided chest pain, which started last night before going to bed. Denies shortness of breath, abdominal pain, nausea, vomiting, diarrhea, numbness, weakness, paresthesia, recent travel or immobility, recent surgery, history of malignancy, history  of DVT or PE, estrogen use.  Patient is afebrile. Vital signs stable. Heart regular rate and rhythm. Lungs clear to auscultation bilaterally. Mild tenderness to palpation of right anterior chest wall. Abdomen soft, nontender, nondistended. No lower extremity edema. Normal neuro exam with no focal deficit.  CBC negative for leukocytosis or anemia. BMP within normal limits. UA with moderate leukocytes, moderate hemoglobin, 7-10 WBC on microscopic. Urine pregnancy negative. EKG sinus rhythm, heart rate 62. Troponin negative. Chest x-ray negative for consolidation, edema, nodule, effusion, pneumothorax.  Patient is well-appearing, feel she is stable for discharge at this time. PERC negative. HEART score 1 given risk factors (DM, tobacco use). Doubt PE or ACS. Return precautions discussed. Patient to follow-up with PCP for further evaluation and management. Will treat UTI with keflex. Advised patient to increase fluid intake. Patient verbalizes her understanding and is in agreement with plan.  BP 123/88 mmHg  Pulse 51  Temp(Src) 97.7 F (36.5 C) (Oral)  Resp 18  Ht  (1.626 m)  Wt 203 lb (92.08 kg)  BMI 34.83 kg/m2  SpO2 100%  LMP  (LMP Unknown)      Mady Gemma, PA-C 11/17/14 1604  Arby Barrette, MD 11/18/14 [phone]

## 2014-11-17 NOTE — Discharge Instructions (Signed)
1. Medications: keflex, albuterol, usual home medications 2. Treatment: rest, drink plenty of fluids 3. Follow Up: please followup with your primary doctor for discussion of your diagnoses and further evaluation after today's visit; please return to the ER for severe headache, dizziness, loss of consciousness, new or worsening symptoms   Urinary Tract Infection Urinary tract infections (UTIs) can develop anywhere along your urinary tract. Your urinary tract is your body's drainage system for removing wastes and extra water. Your urinary tract includes two kidneys, two ureters, a bladder, and a urethra. Your kidneys are a pair of bean-shaped organs. Each kidney is about the size of your fist. They are located below your ribs, one on each side of your spine. CAUSES Infections are caused by microbes, which are microscopic organisms, including fungi, viruses, and bacteria. These organisms are so small that they can only be seen through a microscope. Bacteria are the microbes that most commonly cause UTIs. SYMPTOMS  Symptoms of UTIs may vary by age and gender of the patient and by the location of the infection. Symptoms in young women typically include a frequent and intense urge to urinate and a painful, burning feeling in the bladder or urethra during urination. Older women and men are more likely to be tired, shaky, and weak and have muscle aches and abdominal pain. A fever may mean the infection is in your kidneys. Other symptoms of a kidney infection include pain in your back or sides below the ribs, nausea, and vomiting. DIAGNOSIS To diagnose a UTI, your caregiver will ask you about your symptoms. Your caregiver will also ask you to provide a urine sample. The urine sample will be tested for bacteria and white blood cells. White blood cells are made by your body to help fight infection. TREATMENT  Typically, UTIs can be treated with medication. Because most UTIs are caused by a bacterial infection,  they usually can be treated with the use of antibiotics. The choice of antibiotic and length of treatment depend on your symptoms and the type of bacteria causing your infection. HOME CARE INSTRUCTIONS  If you were prescribed antibiotics, take them exactly as your caregiver instructs you. Finish the medication even if you feel better after you have only taken some of the medication.  Drink enough water and fluids to keep your urine clear or pale yellow.  Avoid caffeine, tea, and carbonated beverages. They tend to irritate your bladder.  Empty your bladder often. Avoid holding urine for long periods of time.  Empty your bladder before and after sexual intercourse.  After a bowel movement, women should cleanse from front to back. Use each tissue only once. SEEK MEDICAL CARE IF:   You have back pain.  You develop a fever.  Your symptoms do not begin to resolve within 3 days. SEEK IMMEDIATE MEDICAL CARE IF:   You have severe back pain or lower abdominal pain.  You develop chills.  You have nausea or vomiting.  You have continued burning or discomfort with urination. MAKE SURE YOU:   Understand these instructions.  Will watch your condition.  Will get help right away if you are not doing well or get worse.   This information is not intended to replace advice given to you by your health care provider. Make sure you discuss any questions you have with your health care provider.   Document Released: 10/13/2004 Document Revised: 09/24/2014 Document Reviewed: 02/11/2011 Elsevier Interactive Patient Education 2016 [employer].   Emergency Department Resource Guide 1) Find a  Doctor and Pay Out of Pocket Although you won't have to find out who is covered by your insurance plan, it is a good idea to ask around and get recommendations. You will then need to call the office and see if the doctor you have chosen will accept you as a new patient and what types of options they offer for  patients who are self-pay. Some doctors offer discounts or will set up payment plans for their patients who do not have insurance, but you will need to ask so you aren't surprised when you get to your appointment.  2) Contact Your Local Health Department Not all health departments have doctors that can see patients for sick visits, but many do, so it is worth a call to see if yours does. If you don't know where your local health department is, you can check in your phone book. The CDC also has a tool to help you locate your state's health department, and many state websites also have listings of all of their local health departments.  3) Find a Walk-in Clinic If your illness is not likely to be very severe or complicated, you may want to try a walk in clinic. These are popping up all over the country in pharmacies, drugstores, and shopping centers. They're usually staffed by nurse practitioners or physician assistants that have been trained to treat common illnesses and complaints. They're usually fairly quick and inexpensive. However, if you have serious medical issues or chronic medical problems, these are probably not your best option.  No Primary Care Doctor: - Call Health Connect at  [phone] - they can help you locate a primary care doctor that  accepts your insurance, provides certain services, etc. - Physician Referral Service- [phone]  Chronic Pain Problems: Organization         Address  Phone   Notes  [facility]  [phone] Patients need to be referred by their primary care doctor.   Medication Assistance: Organization         Address  Phone   Notes  [facility] Medication [facility] [address] [place], [place] [address] [phone] --Must be a resident of [facility] -- Must have NO insurance coverage whatsoever (no Medicaid/ Medicare, etc.) -- The pt. MUST have a primary care doctor that directs their care regularly  and follows them in the community   MedAssist  [phone]   [employer]  [phone]    Agencies that provide inexpensive medical care: Organization         Address  Phone   Notes  [facility]  [phone]   [facility]    [phone]   [facility] [address] [place], [place] [address] [phone]   [facility] [address], [place] [phone]   Planned Parenthood    [phone]   [facility]    [phone]   [facility]  [address], [place] Phone:  [phone], Fax:  [phone] Hours of Operation:  9 am - 6 pm, M-F.  Also accepts Medicaid/Medicare and self-pay.  [facility]  [address], [place] Phone: [phone], Fax: [phone]. Hours of Operation:  8:30 am - 5:30 pm, M-F.  Also accepts Medicaid and self-pay.  [facility] [place] [address] [employer] Phone: [phone])  [phone]   [facility] [address] [place], [place] [phone], Ext. [phone] Mondays & Thursdays: 7-9 AM.  First 15 patients are seen on a first come, first serve basis.    Medicaid-accepting [facility] Providers:  Organization         Address  Phone   Notes  [facility] [address], [place] [phone] Also accepts self-pay patients.  [facility] [address] Laurell Josephs [place], [place]  [phone]   [facility] [address], [place] [phone]   [facility] [address] [place] [phone]   Renaye Rakers [address], [place]   [phone] Only accepts [place] Access [place] patients after they have their name applied to their card.   Self-Pay (no insurance) in [facility]:  Organization         Address  Phone   Notes  Sickle  Cell Patients, [facility] [address] [place], [place] [phone]   [facility] [address] [place], [place] [phone]   [facility] [place]  [address], [place] [phone]   [facility]/Dr. Osei-Bonsu  [address] [place] or [address], [place] [phone] Phone number for both [place] and [place] locations is the same.  Urgent Medical and [facility] [address] [place] [phone]   [facility] [address] [place] or [address] [phone]   [facility] [address] [place] [phone], phone; [phone], fax Sees patients 1st and 3rd Saturday of every month.  Must not qualify for public or private insurance (i.e. Medicaid, Medicare, [place] Health Choice, Veterans' Benefits)  Household income should be no more than 200% of the poverty level The clinic cannot treat you if you are pregnant or think you are pregnant  Sexually transmitted diseases are not treated at the clinic.    Dental Care: Organization         Address  Phone  Notes  [facility] Department of [facility] [address] [place], [place] [phone] Accepts children up to age 18 who are enrolled in [place] or [place] Health Choice; pregnant women with a Medicaid card; and children who have applied for Medicaid or [place] Health Choice, but were declined, whose parents can pay a reduced fee at time of service.  [facility] Department of [facility]  [address], [place] [phone] Accepts children up to age 34 who are enrolled in [place] or [place] Health Choice; pregnant women with a Medicaid card; and children who have applied for Medicaid or [place] Health Choice, but were declined, whose parents can pay a reduced fee at time of service.  [facility]  [address] [place], [place] [phone] Patients are seen by appointment only. Walk-ins are not accepted. [facility] will see patients 72 years of age and older. Monday - Tuesday (8am-5pm) Most Wednesdays (8:30-5pm) $30 per visit, cash only  [facility] Adult Dental Access PROGRAM  [address], [facility] [phone] Patients are seen by appointment only. Walk-ins are not accepted. [facility] will see patients 57 years of age and older. One Wednesday Evening (Monthly: Volunteer Based).  $  30 per visit, cash only  [employer] [facility]  [phone] for adults; Children under age 34, call Graduate Pediatric Dentistry at [phone]) [phone]. Children aged 464-14, please call [phone] to request a pediatric application.  Dental services are provided in all areas of dental care including fillings, crowns and bridges, complete and partial dentures, implants, gum treatment, root canals, and extractions. Preventive care is also provided. Treatment is provided to both adults and children. Patients are selected via a lottery and there is often a waiting list.   [facility] [address], [place]  [phone] [email or web address]   [facility] [address], [place], [place] [phone], Ext. [phone] Second and Fourth Thursday of each month, opens at 6:30 AM; Clinic ends at 9 AM.  Patients are seen on a first-come first-served basis, and a limited number are seen during each clinic.   [facility]  [address], [place], [place] [phone]) [phone]   Eligibility Requirements You must have lived in [place], [place], or [place] for at least the last three months.   You cannot be eligible for state or federal sponsored [place]healthcare insurance, including CIGNAVeterans Administration, [place]icaid, or [employer]icare.   You generally cannot be eligible for healthcare insurance through your employer.    How to apply: Eligibility  screenings are held every Tuesday and Wednesday afternoon from 1:00 pm until 4:00 pm. You do not need an appointment for the interview!  [facility] [address], [place], [place] [phone]   [facility]  [phone]   [facility]  [phone]   [facility]  [phone]    Behavioral Health Resources in the Community: Intensive Outpatient Programs Organization         Address  Phone  Notes  [facility] [address], [place], [place] [phone]   [facility] [address], [place], [place] [phone]   [facility] [address], [place], [place]  [phone]   [facility] [address],  [place], [place] [phone] or [phone]   Substance Abuse Resources Organization         Address  Phone  Notes  [facility]  [phone]   [facility]  [phone]   [facility]  [phone]   [facility]  [phone]   Residential & Outpatient Substance Abuse Program  [phone]   Psychological Services Organization         Address  Phone  Notes  [facility]  [phone]- [phone]   [facility]  [phone]- [phone]   [facility] [address], [place] [phone] or [phone]    Mobile Crisis Teams Organization         Address  Phone  Notes  [facility], Mobile Crisis Care Unit  [phone]   [facility]  [address] [place], [place] [phone]   Doristine LocksSharon DeEsch [address] [place] [phone]    Self-Help/Support Groups Organization         Address  Phone             Notes  [facility] [place] - variety of support groups  336- [phone] Call for more information  Narcotics Anonymous (NA), [facility] [address], [place]  2 meetings at  this location   Statisticianesidential Treatment Programs Organization  Address  Phone  Notes  [facility] [address],    [place]  [phone]   [facility]  [address], [place] [phone], [place], [place] [phone]   [facility] [address], [place] [phone] Admissions: 8am-3pm M-F  [facility] [address]    [place], [place] [phone]   [facility] [address], [place], [place] [phone]   [facility] [address]  [place], [place] [phone]   [facility] - Intensive Outpatient [address], [place], [place] [phone]   [facility] ([facility] [address]  [place], [place] [phone] or [phone]   [facility] ([facility]) [address] [place], [place] [phone] Accepts Medicaid  [facility] [address]  [place] [phone] Substance Abuse/Addiction Treatment   [facility] Resources Organization         Address  Phone  Notes  [facility]  [phone]) [phone]   Angie FavaJulie Brannon, PhD [address] [place], [place]   [phone]) [phone] or [phone]) [phone]   [facility]   [address] [place], [place] [phone]   [facility] [address], [place], [place] [phone]) [phone] Insurance/Medicaid/sponsorship through [facility]  [facility] [address]                                    [place], [place] [phone]) [phone] Therapy/tele-psych/case  [facility] [address]   [place], [place] [phone]) [phone]    Dr. Lolly MustacheArfeen  [phone](336) [phone]   [facility]  [employer] [facility] 1) [address], [place] 2) [address], [place] 3)  [address], [place] [phone]  [phone]   [facility] Child Abuse Hotline [phone]) [phone] or [phone])  [phone] (After Hours)

## 2014-11-17 NOTE — ED Notes (Signed)
C/o feeling light headed since 9am-CP last night-feeling "tight"

## 2014-11-18 ENCOUNTER — Ambulatory Visit (INDEPENDENT_AMBULATORY_CARE_PROVIDER_SITE_OTHER): Payer: Managed Care, Other (non HMO) | Admitting: Internal Medicine

## 2014-11-18 ENCOUNTER — Encounter: Payer: Self-pay | Admitting: Family Medicine

## 2014-11-18 ENCOUNTER — Encounter: Payer: Self-pay | Admitting: Internal Medicine

## 2014-11-18 VITALS — BP 132/104 | Temp 98.2°F | Wt 203.4 lb

## 2014-11-18 DIAGNOSIS — R531 Weakness: Secondary | ICD-10-CM

## 2014-11-18 DIAGNOSIS — M25551 Pain in right hip: Secondary | ICD-10-CM

## 2014-11-18 DIAGNOSIS — IMO0001 Reserved for inherently not codable concepts without codable children: Secondary | ICD-10-CM

## 2014-11-18 DIAGNOSIS — Z87440 Personal history of urinary (tract) infections: Secondary | ICD-10-CM | POA: Diagnosis not present

## 2014-11-18 DIAGNOSIS — Z23 Encounter for immunization: Secondary | ICD-10-CM

## 2014-11-18 DIAGNOSIS — R030 Elevated blood-pressure reading, without diagnosis of hypertension: Secondary | ICD-10-CM

## 2014-11-18 DIAGNOSIS — N390 Urinary tract infection, site not specified: Secondary | ICD-10-CM

## 2014-11-18 LAB — POCT URINALYSIS DIPSTICK
BILIRUBIN UA: NEGATIVE
Blood, UA: 3
GLUCOSE UA: NEGATIVE
KETONES UA: NEGATIVE
LEUKOCYTES UA: NEGATIVE
Nitrite, UA: NEGATIVE
PROTEIN UA: NEGATIVE
Spec Grav, UA: >=1.03
Urobilinogen, UA: 0.2
pH, UA: 6

## 2014-11-18 NOTE — Patient Instructions (Addendum)
Stay on the antibiotic  For     7 days  Urine looks better  Today .    Will be contacted about referral for the right hip area pain.sports medicine .  Stay hydrated  This illness could be a   Viral illness but  Agree with treating for UTI .  Marland Kitchen. bp is up some today   Check readings over the next    month if  Mostly  140/90 and above return for reevaluation.  Keep us updated about meds from your gyne .   Stop tobacco  For health reasons.

## 2014-11-18 NOTE — Progress Notes (Signed)
Pre visit review using our clinic review tool, if applicable. No additional management support is needed unless otherwise documented below in the visit note.   Chief Complaint  Patient presents with  . Rt side pain    HPI: Patient Jillian Copeland  comes in today for SDA for  new problem evaluation.  Told  To go   To ed from [facility] had  Tightness in chest.  And felt weak for 2 days    Last seen  About 18 months ago   Had no insurance for a while  Working now.  Had lightheadedness and  Felt   Weak    bp was 115 or so.  Was seen in ed yesterday  And dx with UTI  Comes in today  Less than 24  Later  .  For above  Neg labs and cv eval  LMP spotting today    no period for a year  Hx irreg and on suppression For a year  Dr Juliene Pina. Is gyne  Had been on   ocps  Stopped summer ran out  Not on insurnace   On continuous      Therapy.  Ed  Not on any meds now.   To start and had neg preg test recent and weeks ago    Right hip? Side  pain worse with activitiy .   Went over summer  felt could be sciatica   But still there  right lateral hip groin area?  Worse when stands a ling time  Favors that side no injury  Noted   No real radiation and not in abdomen  Tobacco :  Down to 3 per day . Soc etoh  ROS: See pertinent positives and negatives per HPI. Was on  No fever syncope  Cough  nvd bleeding sweats  Past Medical History  Diagnosis Date  . Asthma   . Abnormal finding on Pap smear, ASCUS     with neg hpv  . Personal history of amenorrhea     oligo  . Chest pain     eval in er in July  . Pilonidal cyst   . Pilonidal cyst with abscess, I&D 03/24/2011. 03/24/2011  . Cholelithiasis without obstruction 10/11/2010  . Diabetes mellitus without complication (HCC)   . PCOS (polycystic ovarian syndrome)     No family history on file.  Social History   Social History  . Marital Status: Single    Spouse Name: N/A  . Number of Children: N/A  . Years of Education: N/A   Social History Main  Topics  . Smoking status: Current Every Day Smoker -- 0.25 packs/day    Types: Cigarettes  . Smokeless tobacco: Never Used     Comment: less than 10 a day   . Alcohol Use: Yes     Comment: social  . Drug Use: No  . Sexual Activity: Yes    Birth Control/ Protection: None   Other Topics Concern  . 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 cephALEXin (KEFLEX) 500 MG capsule Take 1 capsule (500 mg total) by mouth 2 (two) times daily. 20 capsule 0  . albuterol (PROAIR HFA) 108 (90 BASE) MCG/ACT inhaler Inhale 2 puffs into the lungs every 6 (six) hours as needed. (  Patient not taking: Reported on 11/18/2014) 18 g 1  . beclomethasone (QVAR) 40 MCG/ACT inhaler Inhale 2 puffs into the lungs 2 (two) times daily. (Patient not taking: Reported on 09/22/2014) 1 Inhaler 5  . fluticasone (FLONASE) 50 MCG/ACT nasal spray Place 2 sprays into the nose daily. (Patient not taking: Reported on 09/22/2014) 16 g 0  . levocetirizine (XYZAL) 5 MG tablet Take 1 tablet (5 mg total) by mouth every evening. (Patient not taking: Reported on 09/22/2014) 30 tablet 5  . metFORMIN (GLUCOPHAGE) 500 MG tablet Take 500 mg by mouth 2 (two) times daily with a meal.    . spironolactone (ALDACTONE) 100 MG tablet Take 100 mg by mouth daily.    . nitrofurantoin, macrocrystal-monohydrate, (MACROBID) 100 MG capsule Take 1 capsule (100 mg total) by mouth 2 (two) times daily. X 7 days 14 capsule 0  . predniSONE (DELTASONE) 10 MG tablet Take q day 6,5,4,3,2,1 21 tablet 0   No facility-administered medications prior to visit.     EXAM:  BP 132/104 mmHg  Temp(Src) 98.2 F (36.8 C) (Oral)  Wt 203 lb 6.4 oz (92.262 kg)  LMP  (LMP Unknown)  Body mass index is 34.9 kg/(m^2).  GENERAL: vitals reviewed and listed above, alert, oriented, appears well hydrated and in no acute distress HEENT:  atraumatic, conjunctiva  clear, no obvious abnormalities on inspection of external nose and ears OP : no lesion edema or exudate  NECK: no obvious masses on inspection palpation  LUNGS: clear to auscultation bilaterally, no wheezes, rales or rhonchi, good air movement CV: HRRR, no clubbing cyanosis or  peripheral edema nl cap refill  MS: moves all extremities without noticeable focal  Abnormality  Area o concern right anterior ilieac area and lateral   No abd tenderness   PSYCH: pleasant and cooperative, no obvious depression or anxiety Wt Readings from Last 3 Encounters:  11/18/14 203 lb 6.4 oz (92.262 kg)  11/17/14 203 lb (92.08 kg)  09/30/13 189 lb (85.73 kg)   BP Readings from Last 3 Encounters:  11/18/14 132/104  11/17/14 123/88  09/22/14 122/86   Lab Results  Component Value Date   WBC 10.0 11/17/2014   HGB 12.5 11/17/2014   HCT 37.2 11/17/2014   PLT 417* 11/17/2014   GLUCOSE 102* 11/17/2014   CHOL 184 04/10/2007   TRIG 39 04/10/2007   HDL 59.3 04/10/2007   LDLCALC 117* 04/10/2007   ALT 39* 10/22/2010   AST 23 10/22/2010   NA 137 11/17/2014   K 3.9 11/17/2014   CL 108 11/17/2014   CREATININE 0.76 11/17/2014   BUN 13 11/17/2014   CO2 24 11/17/2014   TSH 0.48 04/10/2007     ASSESSMENT AND PLAN:  Discussed the following assessment and plan:  Feeling weak - acute illness?   see ed note  uti presumed under rx  ua better to daydont see Ucx done continuemedication for now  History of urinary tract infection - Plan: POC Urinalysis Dipstick  Hip pain, right - ? bursitis / itb syndrome  ref sm  - Plan: Ambulatory referral to Sports Medicine  Elevated BP - fu appt if persists    Need for prophylactic vaccination and inoculation against influenza - Plan: Flu Vaccine QUAD 36+ mos PF IM (Fluarix & Fluzone Quad PF)  Urinary tract infection, site not specified  possible  - under rx ED Hx pcos  ? To go back on mesd  Told she had early dm?   Gyne giving med  Looks non toxic  and stale today  Will fu if persists    -Patient advised to return or notify health care team  if symptoms worsen ,persist or new concerns arise.  Patient Instructions  Stay on the antibiotic  For     7 days  Urine looks better  Today .    Will be contacted about referral for the right hip area pain.sports medicine .  Stay hydrated  This illness could be a   Viral illness but  Agree with treating for UTI .  Marland Kitchen bp is up some today   Check readings over the next    month if  Mostly  140/90 and above return for reevaluation.  Keep [place] updated about meds from your gyne .   Stop tobacco  For health reasons.   Neta Mends. Governor Matos M.D.

## 2014-11-28 ENCOUNTER — Ambulatory Visit: Payer: Self-pay | Admitting: Internal Medicine

## 2014-12-01 ENCOUNTER — Other Ambulatory Visit: Payer: Managed Care, Other (non HMO)

## 2014-12-01 ENCOUNTER — Other Ambulatory Visit (INDEPENDENT_AMBULATORY_CARE_PROVIDER_SITE_OTHER): Payer: Managed Care, Other (non HMO)

## 2014-12-01 ENCOUNTER — Encounter: Payer: Self-pay | Admitting: *Deleted

## 2014-12-01 ENCOUNTER — Ambulatory Visit (INDEPENDENT_AMBULATORY_CARE_PROVIDER_SITE_OTHER): Payer: Managed Care, Other (non HMO) | Admitting: Family Medicine

## 2014-12-01 ENCOUNTER — Encounter: Payer: Self-pay | Admitting: Family Medicine

## 2014-12-01 VITALS — BP 112/82 | HR 76 | Wt 202.0 lb

## 2014-12-01 DIAGNOSIS — M7061 Trochanteric bursitis, right hip: Secondary | ICD-10-CM | POA: Diagnosis not present

## 2014-12-01 DIAGNOSIS — M25551 Pain in right hip: Secondary | ICD-10-CM

## 2014-12-01 NOTE — Progress Notes (Signed)
Tawana ScaleZach Smith D.O. [facility] [address] [place], [place] [address] Phone: [phone] Subjective:    I'm seeing this patient by the request  of:  Lorretta HarpPANOSH,WANDA KOTVAN, MD   CC: right hip pain.   BJY:[id number]:Subjective Jillian Copeland is a 29 y.o. female coming in with complaint of right hip pain. Patient states that this is been going on for 3 months. Seems to be worse with standing long amount of time. Patient has changed jobs in the last 3 months and thinks that this is exacerbating her. Standing on concrete for a long amount of time. Patient is a pain as a dull throbbing aching sensation on the lateral aspect of the hip. Can give her discomfort at night. No radiation. No numbness. No weakness. Patient has tried sleeping on a heating pad which seems to be really thing that has been helpful. Has not taken over-the-counter medications.  Past Medical History  Diagnosis Date  . Asthma   . Abnormal finding on Pap smear, ASCUS     with neg hpv  . Personal history of amenorrhea     oligo  . Chest pain     eval in er in July  . Pilonidal cyst   . Pilonidal cyst with abscess, I&D 03/24/2011. 03/24/2011  . Cholelithiasis without obstruction 10/11/2010  . Diabetes mellitus without complication (HCC)   . PCOS (polycystic ovarian syndrome)    Past Surgical History  Procedure Laterality Date  . Cholecystectomy  2012  . Pilonidal cyst drainage  2013   Social History  Substance Use Topics  . Smoking status: Current Every Day Smoker -- 0.25 packs/day    Types: Cigarettes  . Smokeless tobacco: Never Used     Comment: less than 10 a day   . Alcohol Use: Yes     Comment: social   Allergies  Allergen Reactions  . Codeine Nausea And Vomiting   No family history on file.      Past medical history, social, surgical and family history all reviewed in electronic medical record.   Review of Systems: No headache, visual changes, nausea, vomiting, diarrhea, constipation, dizziness,  abdominal pain, skin rash, fevers, chills, night sweats, weight loss, swollen lymph nodes, body aches, joint swelling, muscle aches, chest pain, shortness of breath, mood changes.   Objective Blood pressure 112/82, pulse 76, weight 202 lb (91.627 kg).  General: No apparent distress alert and oriented x3 mood and affect normal, dressed appropriately.  HEENT: Pupils equal, extraocular movements intact  Respiratory: Patient's speak in full sentences and does not appear short of breath  Cardiovascular: No lower extremity edema, non tender, no erythema  Skin: Warm dry intact with no signs of infection or rash on extremities or on axial skeleton.  Abdomen: Soft nontender  Neuro: Cranial nerves II through XII are intact, neurovascularly intact in all extremities with 2+ DTRs and 2+ pulses.  Lymph: No lymphadenopathy of posterior or anterior cervical chain or axillae bilaterally.  Gait normal with good balance and coordination.  MSK:  Non tender with full range of motion and good stability and symmetric strength and tone of shoulders, elbows, wrist,  knee and ankles bilaterally.  Hip: right ROM IR: 15 Deg, ER: 45 Deg, Flexion: 120 Deg, Extension: 100 Deg, Abduction: 45 Deg, Adduction: 45 Deg Strength IR: 5/5, ER: 5/5, Flexion: 5/5, Extension: 5/5, Abduction: 5/5, Adduction: 5/5 Pelvic alignment unremarkable to inspection and palpation. Standing hip rotation and gait without trendelenburg sign / unsteadiness. Tender over the greater trochanteric area  No pain with FABER or FADIR. No SI joint tenderness and normal minimal SI movement.  Procedure note 97110; 15 minutes spent for Therapeutic exercises as stated in above notes.  This included exercises focusing on stretching, strengthening, with significant focus on eccentric aspects. Pelvic tilt/bracing to help with proper recruitment of the lower abs and pelvic floor muscles  Glute strengthening to properly contract glutes without over-engaging low  back and hamstrings - prone hip extension and glute bridge exercises Proper stretching techniques to increase effectiveness for the hip flexors, groin, quads, piriformis and low back when appropriate   Proper technique shown and discussed handout in great detail with ATC.  All questions were discussed and answered.     Impression and Recommendations:     This case required medical decision making of moderate complexity.

## 2014-12-01 NOTE — Patient Instructions (Addendum)
Good to see you.  Sorry for running behind today Ice 20 minutes 2 times daily. Usually after activity and before bed. Exercises 3 times a week.  pennsaid pinkie amount topically 2 times daily as needed.  Good shoes with rigid bottom.  Dierdre HarnessKeen, Dansko, Merrell or New balance greater then 700 Exercises on wall.  Heel and butt touching.  Raise leg 6 inches and hold 2 seconds.  Down slow for count of 4 seconds.  1 set of 30 reps daily on both sides.  See me again in 4 weeks.

## 2014-12-01 NOTE — Assessment & Plan Note (Signed)
Patient has elected try conservative therapy. Patient did go over home exercises, icing protocol, topical anti-inflammatories. Patient will try these different changes and come back and see me again in 3 weeks. If continuing have pain we will consider injection as well as formal physical therapy. Patient given a note for work to avoid excessive standing at this moment.

## 2014-12-26 ENCOUNTER — Ambulatory Visit: Payer: Managed Care, Other (non HMO) | Admitting: Family Medicine

## 2014-12-26 DIAGNOSIS — Z0289 Encounter for other administrative examinations: Secondary | ICD-10-CM

## 2015-01-27 ENCOUNTER — Telehealth: Payer: Self-pay | Admitting: Internal Medicine

## 2015-01-27 ENCOUNTER — Ambulatory Visit (INDEPENDENT_AMBULATORY_CARE_PROVIDER_SITE_OTHER): Payer: Managed Care, Other (non HMO) | Admitting: Adult Health

## 2015-01-27 VITALS — BP 120/80 | HR 92 | Temp 98.2°F | Ht 64.0 in | Wt 204.1 lb

## 2015-01-27 DIAGNOSIS — J209 Acute bronchitis, unspecified: Secondary | ICD-10-CM | POA: Diagnosis not present

## 2015-01-27 MED ORDER — BECLOMETHASONE DIPROPIONATE 40 MCG/ACT IN AERS: 2.0000 | INHALATION_SPRAY | Freq: Two times a day (BID) | RESPIRATORY_TRACT | Status: DC

## 2015-01-27 MED ORDER — HYDROCODONE-HOMATROPINE 5-1.5 MG/5ML PO SYRP: 5.0000 mL | ORAL_SOLUTION | Freq: Three times a day (TID) | ORAL | Status: DC | PRN

## 2015-01-27 MED ORDER — METHYLPREDNISOLONE 4 MG PO TBPK: ORAL_TABLET | ORAL | Status: DC

## 2015-01-27 NOTE — Telephone Encounter (Signed)
error 

## 2015-01-27 NOTE — Progress Notes (Signed)
Subjective:    Patient ID: Jillian Copeland, female    DOB: 12-08-1985, 30 y.o.   MRN: [id number]  HPI  30 year old female who presents to the office today for three weeks of a dry cough. She endorses that it first started out as a sinus infection but that the sinus infection has resolved.   She denies any fever, nausea, vomiting, diarrhea,   She has tried cough drops, teas, over the counter medication without any improvement.   She has not been using any of her inhalers.   Review of Systems  Constitutional: Negative.   HENT: Negative.   Respiratory: Positive for cough, shortness of breath and wheezing.   Cardiovascular: Negative.   Neurological: Negative.   All other systems reviewed and are negative.  Past Medical History  Diagnosis Date  . Asthma   . Abnormal finding on Pap smear, ASCUS     with neg hpv  . Personal history of amenorrhea     oligo  . Chest pain     eval in er in July  . Pilonidal cyst   . Pilonidal cyst with abscess, I&D 03/24/2011. 03/24/2011  . Cholelithiasis without obstruction 10/11/2010  . Diabetes mellitus without complication (HCC)   . PCOS (polycystic ovarian syndrome)     Social History   Social History  . Marital Status: Single    Spouse Name: N/A  . Number of Children: N/A  . Years of Education: N/A   Occupational History  . Not on file.   Social History Main Topics  . Smoking status: Current Every Day Smoker -- 0.25 packs/day    Types: Cigarettes  . Smokeless tobacco: Never Used     Comment: less than 10 a day   . Alcohol Use: Yes     Comment: social  . Drug Use: No  . Sexual Activity: Yes    Birth Control/ Protection: None   Other Topics Concern  . 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 Cholecystectomy  2012  . Pilonidal cyst drainage  2013    No family history on  file.  Allergies  Allergen Reactions  . Codeine Nausea And Vomiting    Current Outpatient Prescriptions on File Prior to Visit  Medication Sig Dispense Refill  . albuterol (PROAIR HFA) 108 (90 BASE) MCG/ACT inhaler Inhale 2 puffs into the lungs every 6 (six) hours as needed. (Patient not taking: Reported on 11/18/2014) 18 g 1  . cephALEXin (KEFLEX) 500 MG capsule Take 1 capsule (500 mg total) by mouth 2 (two) times daily. (Patient not taking: Reported on 01/27/2015) 20 capsule 0  . fluticasone (FLONASE) 50 MCG/ACT nasal spray Place 2 sprays into the nose daily. (Patient not taking: Reported on 09/22/2014) 16 g 0  . levocetirizine (XYZAL) 5 MG tablet Take 1 tablet (5 mg total) by mouth every evening. (Patient not taking: Reported on 09/22/2014) 30 tablet 5  . metFORMIN (GLUCOPHAGE) 500 MG tablet Take 500 mg by mouth 2 (two) times daily with a meal. Reported on 01/27/2015    . spironolactone (ALDACTONE) 100 MG tablet Take 100 mg by mouth daily. Reported on 01/27/2015     No current facility-administered medications on file prior to visit.    BP 120/80 mmHg  Pulse 92  Temp(Src)  98.2 F (36.8 C) (Oral)  Ht 5\' 4"  (1.626 m)  Wt 204 lb 1.6 oz (92.579 kg)  BMI 35.02 kg/m2  SpO2 98%       Objective:   Physical Exam  Constitutional: She is oriented to person, place, and time. She appears well-developed and well-nourished. No distress.  HENT:  Head: Normocephalic and atraumatic.  Right Ear: External ear normal.  Left Ear: External ear normal.  Nose: Nose normal.  Mouth/Throat: Oropharynx is clear and moist. No oropharyngeal exudate.  Neck: Normal range of motion. Neck supple. No thyromegaly present.  Cardiovascular: Normal rate, regular rhythm, normal heart sounds and intact distal pulses.  Exam reveals no gallop and no friction rub.   No murmur heard. Pulmonary/Chest: Effort normal. No respiratory distress. She has wheezes (bilateral full fields). She has no rales. She exhibits no  tenderness.  Lymphadenopathy:    She has no cervical adenopathy.  Neurological: She is alert and oriented to person, place, and time.  Skin: Skin is warm and dry. No rash noted. She is not diaphoretic. No erythema. No pallor.  Psychiatric: She has a normal mood and affect. Her behavior is normal. Judgment and thought content normal.  Nursing note and vitals reviewed.     Assessment & Plan:  1. Acute bronchitis, unspecified organism - May be asthma flare.  - HYDROcodone-homatropine (HYCODAN) 5-1.5 MG/5ML syrup; Take 5 mLs by mouth every 8 (eight) hours as needed for cough.  Dispense: 120 mL; Refill: 0 - methylPREDNISolone (MEDROL DOSEPAK) 4 MG TBPK tablet; Take as directed  Dispense: 21 tablet; Refill: 0 - beclomethasone (QVAR) 40 MCG/ACT inhaler; Inhale 2 puffs into the lungs 2 (two) times daily.  Dispense: 1 Inhaler; Refill: 5

## 2015-01-27 NOTE — Progress Notes (Signed)
Pre visit review using our clinic review tool, if applicable. No additional management support is needed unless otherwise documented below in the visit note. 

## 2015-01-27 NOTE — Patient Instructions (Addendum)
It was great meeting you today! I have sent in a prescription for Prednisone and the Qvar.   Use the cough syrup as needed in the evening as it will make you sleepy.   Please follow up if you do not notice any improvement in the next 2-3 days.

## 2015-04-06 ENCOUNTER — Other Ambulatory Visit: Payer: Managed Care, Other (non HMO)

## 2015-04-13 ENCOUNTER — Encounter: Payer: Managed Care, Other (non HMO) | Admitting: Internal Medicine

## 2015-07-08 NOTE — Progress Notes (Signed)
Pre visit review using our clinic review tool, if applicable. No additional management support is needed unless otherwise documented below in the visit note.  Chief Complaint  Patient presents with  . Back Pain    Lower back.  Started over [employer].  Now doing more physical labor at work.    HPI: Jillian Copeland 30 y.o.  comesin today new acute problem  She has moved to [place] and working at  [employer]   In [employer] . 10 hours shifts had onset  Without injury lbp and lateral   Worse when she gets out of bed in the am  No hx of same  Onset  mem weekend and went to a  urgen care and given shot of cortisone in back and then went to local ed on Monday and massages and chiropractore   Better but continuing   No hx of same .  Feels   Work and issue  And more lifiting   .  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   Sx Waxes and wanes  . No ideas for worse  And better .   Up to 10 at times  .    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   She feels she can  ROS: See pertinent positives and negatives per HPI.no fever  utis x other   Past Medical History  Diagnosis Date  . Asthma   . Abnormal finding on Pap smear, ASCUS     with neg hpv  . Personal history of amenorrhea     oligo  . Chest pain     eval in er in July  . Pilonidal cyst   . Pilonidal cyst with abscess, I&D 03/24/2011. 03/24/2011  . Cholelithiasis without obstruction 10/11/2010  . Diabetes mellitus without complication (HCC)   . PCOS (polycystic ovarian syndrome)     No family history on file.  Social History   Social History  . Marital Status: Single    Spouse Name: N/A  . Number of Children: N/A  . Years of Education: N/A   Social History Main Topics  . Smoking status: Current Every Day Smoker -- 0.25 packs/day    Types: Cigarettes    . Smokeless tobacco: Never Used     Comment: less than 10 a day   . Alcohol Use: Yes     Comment: social  . Drug Use: No  . Sexual Activity: Yes    Birth Control/ Protection: None   Other Topics Concern  . 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 beclomethasone (QVAR) 40 MCG/ACT inhaler Inhale 2 puffs into the lungs 2 (two) times daily. 1 Inhaler 5  . fluticasone (FLONASE) 50 MCG/ACT nasal spray Place 2 sprays into the nose daily. 16 g 0  . albuterol (PROAIR HFA) 108 (90 BASE) MCG/ACT inhaler Inhale 2 puffs into the lungs every 6 (six) hours as needed. (Patient not taking: Reported on 11/18/2014) 18 g 1  .  cephALEXin (KEFLEX) 500 MG capsule Take 1 capsule (500 mg total) by mouth 2 (two) times daily. (Patient not taking: Reported on 01/27/2015) 20 capsule 0  . HYDROcodone-homatropine (HYCODAN) 5-1.5 MG/5ML syrup Take 5 mLs by mouth every 8 (eight) hours as needed for cough. 120 mL 0  . levocetirizine (XYZAL) 5 MG tablet Take 1 tablet (5 mg total) by mouth every evening. (Patient not taking: Reported on 09/22/2014) 30 tablet 5  . metFORMIN (GLUCOPHAGE) 500 MG tablet Take 500 mg by mouth 2 (two) times daily with a meal. Reported on 01/27/2015    . methylPREDNISolone (MEDROL DOSEPAK) 4 MG TBPK tablet Take as directed 21 tablet 0  . spironolactone (ALDACTONE) 100 MG tablet Take 100 mg by mouth daily. Reported on 01/27/2015     No facility-administered medications prior to visit.     EXAM:  BP 136/96 mmHg  Temp(Src) 98.4 F (36.9 C) (Oral)  Wt 200 lb 11.2 oz (91.037 kg)  Body mass index is 34.43 kg/(m^2).  GENERAL: vitals reviewed and listed above, alert, oriented, appears well hydrated and in no acute distress HEENT: atraumatic, conjunctiva  clear, no obvious abnormalities on inspection of external nose and ears  NECK: no obvious masses  on inspection palpation  LUNGS: clear to auscultation bilaterally, no wheezes, rales or rhonchi, good air movement CV: HRRR, no clubbing cyanosis or  peripheral edema nl cap refill  Back no points tendernes s  Points to low back bothe sides no central spot tenderness   Nl toe heel strength neg slr  nuero grossly intact  MS: moves all extremities without noticeable focal  abnormality PSYCH: pleasant and cooperative, no obvious depression or anxiety  ASSESSMENT AND PLAN:  Discussed the following assessment and plan:  Low back pain without sciatica, unspecified back pain laterality - appears mechanical no alarm featuers has seen ed and uc  and chiro  i dont have those notes looks good today assess work station   Solicitor note to return .  Suggests look at work flow and lifting  Reg exercise  Some weight loss may help also  Pt agrees .  Consider PT other.  meds not that helpful but can try nsaid if needed as needed  -Patient advised to return or notify health care team  if symptoms worsen ,persist or new concerns arise. Total visit > 50% spent counseling and coordinating care as indicated in above note and in instructions to patient .  Sign up for my chart  To help  Communicate   Fu . May need to see someone in [place]  But attention to back health  Etc may be what is needed   Strengthen core etc  Patient Instructions  This sounds like  mechanical  ...  Back issues .    Most meds dont help back get better .   Exercises and  Adapting   Position is advised  Activity at work .    Ice stretch . consider getting   Work Acupuncturist evaluated  To avoid chronic  problem    Back Exercises The following exercises strengthen the muscles that help to support the back. They also help to keep the lower back flexible. Doing these exercises can help to prevent back pain or lessen existing pain. If you have back pain or discomfort, try doing these exercises 2-3 times each day or as  told by your health care provider. When the pain goes away, do them once each day, but increase the number  of times that you repeat the steps for each exercise (do more repetitions). If you do not have back pain or discomfort, do these exercises once each day or as told by your health care provider. EXERCISES Single Knee to Chest Repeat these steps 3-5 times for each leg: 1. Lie on your back on a firm bed or the floor with your legs extended. 2. Bring one knee to your chest. Your other leg should stay extended and in contact with the floor. 3. Hold your knee in place by grabbing your knee or thigh. 4. Pull on your knee until you feel a gentle stretch in your lower back. 5. Hold the stretch for 10-30 seconds. 6. Slowly release and straighten your leg. Pelvic Tilt Repeat these steps 5-10 times: 1. Lie on your back on a firm bed or the floor with your legs extended. 2. Bend your knees so they are pointing toward the ceiling and your feet are flat on the floor. 3. Tighten your lower abdominal muscles to press your lower back against the floor. This motion will tilt your pelvis so your tailbone points up toward the ceiling instead of pointing to your feet or the floor. 4. With gentle tension and even breathing, hold this position for 5-10 seconds. Cat-Cow Repeat these steps until your lower back becomes more flexible: 1. Get into a hands-and-knees position on a firm surface. Keep your hands under your shoulders, and keep your knees under your hips. You may place padding under your knees for comfort. 2. Let your head hang down, and point your tailbone toward the floor so your lower back becomes rounded like the back of a cat. 3. Hold this position for 5 seconds. 4. Slowly lift your head and point your tailbone up toward the ceiling so your back forms a sagging arch like the back of a cow. 5. Hold this position for 5 seconds. Press-Ups Repeat these steps 5-10 times: 1. Lie on your abdomen  (face-down) on the floor. 2. Place your palms near your head, about shoulder-width apart. 3. While you keep your back as relaxed as possible and keep your hips on the floor, slowly straighten your arms to raise the top half of your body and lift your shoulders. Do not use your back muscles to raise your upper torso. You may adjust the placement of your hands to make yourself more comfortable. 4. Hold this position for 5 seconds while you keep your back relaxed. 5. Slowly return to lying flat on the floor. Bridges Repeat these steps 10 times: 1. Lie on your back on a firm surface. 2. Bend your knees so they are pointing toward the ceiling and your feet are flat on the floor. 3. Tighten your buttocks muscles and lift your buttocks off of the floor until your waist is at almost the same height as your knees. You should feel the muscles working in your buttocks and the back of your thighs. If you do not feel these muscles, slide your feet 1-2 inches farther away from your buttocks. 4. Hold this position for 3-5 seconds. 5. Slowly lower your hips to the starting position, and allow your buttocks muscles to relax completely. If this exercise is too easy, try doing it with your arms crossed over your chest. Abdominal Crunches Repeat these steps 5-10 times: 1. Lie on your back on a firm bed or the floor with your legs extended. 2. Bend your knees so they are pointing toward the ceiling and your feet are flat  on the floor. 3. Cross your arms over your chest. 4. Tip your chin slightly toward your chest without bending your neck. 5. Tighten your abdominal muscles and slowly raise your trunk (torso) high enough to lift your shoulder blades a tiny bit off of the floor. Avoid raising your torso higher than that, because it can put too much stress on your low back and it does not help to strengthen your abdominal muscles. 6. Slowly return to your starting position. Back Lifts Repeat these steps 5-10  times: 1. Lie on your abdomen (face-down) with your arms at your sides, and rest your forehead on the floor. 2. Tighten the muscles in your legs and your buttocks. 3. Slowly lift your chest off of the floor while you keep your hips pressed to the floor. Keep the back of your head in line with the curve in your back. Your eyes should be looking at the floor. 4. Hold this position for 3-5 seconds. 5. Slowly return to your starting position. SEEK MEDICAL CARE IF:  Your back pain or discomfort gets much worse when you do an exercise.  Your back pain or discomfort does not lessen within 2 hours after you exercise. If you have any of these problems, stop doing these exercises right away. Do not do them again unless your health care provider says that you can. SEEK IMMEDIATE MEDICAL CARE IF:  You develop sudden, severe back pain. If this happens, stop doing the exercises right away. Do not do them again unless your health care provider says that you can.   This information is not intended to replace advice given to you by your health care provider. Make sure you discuss any questions you have with your health care provider.   Document Released: 02/11/2004 Document Revised: 09/24/2014 Document Reviewed: 02/27/2014 Elsevier Interactive Patient Education 2016 [employer].          [place]. Panosh M.D.

## 2015-07-09 ENCOUNTER — Encounter: Payer: Self-pay | Admitting: Internal Medicine

## 2015-07-09 ENCOUNTER — Ambulatory Visit (INDEPENDENT_AMBULATORY_CARE_PROVIDER_SITE_OTHER): Payer: 59 | Admitting: Internal Medicine

## 2015-07-09 VITALS — BP 136/96 | Temp 98.4°F | Wt 200.7 lb

## 2015-07-09 DIAGNOSIS — M545 Low back pain: Secondary | ICD-10-CM | POA: Diagnosis not present

## 2015-07-09 NOTE — Patient Instructions (Signed)
This sounds like  mechanical  ...  Back issues .    Most meds dont help back get better .   Exercises and  Adapting   Position is advised  Activity at work .    Ice stretch . consider getting   Work Acupuncturiststation  erogdynamically evaluated  To avoid chronic  problem    Back Exercises The following exercises strengthen the muscles that help to support the back. They also help to keep the lower back flexible. Doing these exercises can help to prevent back pain or lessen existing pain. If you have back pain or discomfort, try doing these exercises 2-3 times each day or as told by your health care provider. When the pain goes away, do them once each day, but increase the number of times that you repeat the steps for each exercise (do more repetitions). If you do not have back pain or discomfort, do these exercises once each day or as told by your health care provider. EXERCISES Single Knee to Chest Repeat these steps 3-5 times for each leg: 1. Lie on your back on a firm bed or the floor with your legs extended. 2. Bring one knee to your chest. Your other leg should stay extended and in contact with the floor. 3. Hold your knee in place by grabbing your knee or thigh. 4. Pull on your knee until you feel a gentle stretch in your lower back. 5. Hold the stretch for 10-30 seconds. 6. Slowly release and straighten your leg. Pelvic Tilt Repeat these steps 5-10 times: 1. Lie on your back on a firm bed or the floor with your legs extended. 2. Bend your knees so they are pointing toward the ceiling and your feet are flat on the floor. 3. Tighten your lower abdominal muscles to press your lower back against the floor. This motion will tilt your pelvis so your tailbone points up toward the ceiling instead of pointing to your feet or the floor. 4. With gentle tension and even breathing, hold this position for 5-10 seconds. Cat-Cow Repeat these steps until your lower back becomes more flexible: 1. Get into a  hands-and-knees position on a firm surface. Keep your hands under your shoulders, and keep your knees under your hips. You may place padding under your knees for comfort. 2. Let your head hang down, and point your tailbone toward the floor so your lower back becomes rounded like the back of a cat. 3. Hold this position for 5 seconds. 4. Slowly lift your head and point your tailbone up toward the ceiling so your back forms a sagging arch like the back of a cow. 5. Hold this position for 5 seconds. Press-Ups Repeat these steps 5-10 times: 1. Lie on your abdomen (face-down) on the floor. 2. Place your palms near your head, about shoulder-width apart. 3. While you keep your back as relaxed as possible and keep your hips on the floor, slowly straighten your arms to raise the top half of your body and lift your shoulders. Do not use your back muscles to raise your upper torso. You may adjust the placement of your hands to make yourself more comfortable. 4. Hold this position for 5 seconds while you keep your back relaxed. 5. Slowly return to lying flat on the floor. Bridges Repeat these steps 10 times: 1. Lie on your back on a firm surface. 2. Bend your knees so they are pointing toward the ceiling and your feet are flat on the floor. 3.  Tighten your buttocks muscles and lift your buttocks off of the floor until your waist is at almost the same height as your knees. You should feel the muscles working in your buttocks and the back of your thighs. If you do not feel these muscles, slide your feet 1-2 inches farther away from your buttocks. 4. Hold this position for 3-5 seconds. 5. Slowly lower your hips to the starting position, and allow your buttocks muscles to relax completely. If this exercise is too easy, try doing it with your arms crossed over your chest. Abdominal Crunches Repeat these steps 5-10 times: 1. Lie on your back on a firm bed or the floor with your legs extended. 2. Bend your knees  so they are pointing toward the ceiling and your feet are flat on the floor. 3. Cross your arms over your chest. 4. Tip your chin slightly toward your chest without bending your neck. 5. Tighten your abdominal muscles and slowly raise your trunk (torso) high enough to lift your shoulder blades a tiny bit off of the floor. Avoid raising your torso higher than that, because it can put too much stress on your low back and it does not help to strengthen your abdominal muscles. 6. Slowly return to your starting position. Back Lifts Repeat these steps 5-10 times: 1. Lie on your abdomen (face-down) with your arms at your sides, and rest your forehead on the floor. 2. Tighten the muscles in your legs and your buttocks. 3. Slowly lift your chest off of the floor while you keep your hips pressed to the floor. Keep the back of your head in line with the curve in your back. Your eyes should be looking at the floor. 4. Hold this position for 3-5 seconds. 5. Slowly return to your starting position. SEEK MEDICAL CARE IF:  Your back pain or discomfort gets much worse when you do an exercise.  Your back pain or discomfort does not lessen within 2 hours after you exercise. If you have any of these problems, stop doing these exercises right away. Do not do them again unless your health care provider says that you can. SEEK IMMEDIATE MEDICAL CARE IF:  You develop sudden, severe back pain. If this happens, stop doing the exercises right away. Do not do them again unless your health care provider says that you can.   This information is not intended to replace advice given to you by your health care provider. Make sure you discuss any questions you have with your health care provider.   Document Released: 02/11/2004 Document Revised: 09/24/2014 Document Reviewed: 02/27/2014 Elsevier Interactive Patient Education [employer]2016 Elsevier Inc.

## 2015-07-10 ENCOUNTER — Encounter: Payer: Self-pay | Admitting: Family Medicine

## 2015-07-10 ENCOUNTER — Telehealth: Payer: Self-pay | Admitting: Internal Medicine

## 2015-07-10 ENCOUNTER — Encounter: Payer: Self-pay | Admitting: Internal Medicine

## 2015-07-10 NOTE — Telephone Encounter (Signed)
Pt to call back with a fax number

## 2015-07-10 NOTE — Telephone Encounter (Signed)
Patient stated to fax to attn: Jillian Copeland at [phone] by 3:30pm today, and she would like to return to work on Sunday 09/11/15.

## 2015-07-10 NOTE — Telephone Encounter (Signed)
Pt states you are expecting her call. Would like you to call back.

## 2015-07-10 NOTE — Telephone Encounter (Signed)
Faxed. Pt notified 

## 2016-05-09 NOTE — Progress Notes (Signed)
Chief Complaint  Patient presents with  . Breathing Problem    sinus problem     HPI: Jillian Copeland 31 y.o. come in for Chronic disease management  Fu asthma allergy  Last ov June 17 for back pain  Was living in [place] and recently moved to [place] work in [employer]. Did a outside walk for her mom's work spent a lot of time outside in the pollen began having coughing wheezing upper respiratory congestion. She's been using her inhaler "quite a lot" and its beginning to stop working as well. Feels like she has nasal congestion is not taking Flonase is down to 2 cigarettes a day  "Not really "tobacco 2 per day .    Using  A lot   Inhaler   ROS: See pertinent positives and negatives per HPI. Review of records shows that she has been on controller cortisone inhaler Singulair is on Flonase in the past but has not needed this in the last year. She has an appointment with an allergist in [place] next week.  Past Medical History:  Diagnosis Date  . Abnormal finding on Pap smear, ASCUS    with neg hpv  . Asthma   . Chest pain    eval in er in July  . Cholelithiasis without obstruction 10/11/2010  . Diabetes mellitus without complication (HCC)   . PCOS (polycystic ovarian syndrome)   . Personal history of amenorrhea    oligo  . Pilonidal cyst   . Pilonidal cyst with abscess, I&D 03/24/2011. 03/24/2011    No family history on file.  Social History   Social History  . Marital status: Single    Spouse name: N/A  . Number of children: N/A  . Years of education: N/A   Social History Main Topics  . Smoking status: Current Every Day Smoker    Packs/day: 0.25    Types: Cigarettes  . Smokeless tobacco: Never Used     Comment: less than 10 a day   . Alcohol use Yes     Comment: social  . Drug use: No  . Sexual activity: Yes    Birth control/ protection: None   Other Topics Concern  . 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 beclomethasone (QVAR) 40 MCG/ACT inhaler Inhale 2 puffs into the lungs 2 (two) times daily. 1 Inhaler 5  . diclofenac (VOLTAREN) 75 MG EC tablet 1 tab twice daily as needed    . fexofenadine (ALLEGRA) 180 MG tablet Take 1-2 tabs every day    . fluticasone (FLONASE) 50 MCG/ACT nasal spray Place 2 sprays into the nose daily. (Patient not taking: Reported on 05/10/2016) 16 g 0  . methocarbamol (ROBAXIN) 500 MG tablet 1 tab four times daily    . montelukast (SINGULAIR) 10 MG tablet 1 tab in the evening     No facility-administered medications prior to visit.      EXAM:  BP 130/90 (BP Location: Right Arm, Patient Position: Sitting, Cuff Size: Normal)   Pulse 92   Temp 98.1 F (36.7 C) (Oral)   Ht  (1.626 m)   Wt 208 lb 12.8 oz (94.7 kg)   SpO2 96%   BMI 35.84 kg/m   Body mass index is 35.84 kg/m.  GENERAL: vitals reviewed  and listed above, alert, oriented, appears well hydrated and in no acute distress  Nl speech  upper congestion  Noted  Nl speech HEENT: atraumatic, conjunctiva  clear, no obvious abnormalities on inspection of external nose and ears tm clear   Nares  boggy  OP : no lesion edema or exudate  NECK: no obvious masses on inspection palpation  LUNGS:  Dec bs  But present   ocass wheeze  Musical Improved aeration after duo nebulization and she feels better. Occasional musical wheeze. CV: HRRR, no clubbing cyanosis or  peripheral edema nl cap refill  MS: moves all extremities without noticeable focal  abnormality PSYCH: pleasant and cooperative, no obvious depression or anxiety  BP Readings from Last 3 Encounters:  05/10/16 130/90  07/09/15 (!) 136/96  01/27/15 120/80    ASSESSMENT AND PLAN:  Discussed the following assessment and plan:  Exacerbation of asthma, unspecified asthma severity, unspecified whether persistent  Allergic rhinitis, unspecified  seasonality, unspecified trigger - Plan: fluticasone (FLONASE) 50 MCG/ACT nasal spray  Environmental allergies  Tobacco use - light  few  trying to stop.  transition over care to allergist  -Patient advised to return or notify health care team  if  new concerns arise.  Patient Instructions   You are probably having an allergy triggered asthma flare with upper respiratory symptoms.  I don't see signs of infection today  Keep appointment with the allergist.  Take prednisone 3-5 days and add on Symbicort inhaler as a controller inhaler until you seer allergist.   Also begin Flonase 2 sprays each side of the nose once a day to decrease allergy nose symptoms.  No tobacco exposure   Would have your  New allergist take over medication as needed to control allergy asthma    Neta Mends. Bertran Zeimet M.D.

## 2016-05-10 ENCOUNTER — Ambulatory Visit (INDEPENDENT_AMBULATORY_CARE_PROVIDER_SITE_OTHER): Payer: 59 | Admitting: Internal Medicine

## 2016-05-10 ENCOUNTER — Encounter: Payer: Self-pay | Admitting: Internal Medicine

## 2016-05-10 VITALS — BP 130/90 | HR 92 | Temp 98.1°F | Ht 64.0 in | Wt 208.8 lb

## 2016-05-10 DIAGNOSIS — Z720 Tobacco use: Secondary | ICD-10-CM | POA: Diagnosis not present

## 2016-05-10 DIAGNOSIS — J45901 Unspecified asthma with (acute) exacerbation: Secondary | ICD-10-CM

## 2016-05-10 DIAGNOSIS — J309 Allergic rhinitis, unspecified: Secondary | ICD-10-CM

## 2016-05-10 DIAGNOSIS — Z9109 Other allergy status, other than to drugs and biological substances: Secondary | ICD-10-CM | POA: Diagnosis not present

## 2016-05-10 MED ORDER — FLUTICASONE PROPIONATE 50 MCG/ACT NA SUSP: 2.0000 | Freq: Every day | NASAL | 3 refills | Status: AC

## 2016-05-10 MED ORDER — PREDNISONE 20 MG PO TABS: 20.0000 mg | ORAL_TABLET | Freq: Two times a day (BID) | ORAL | 0 refills | Status: DC

## 2016-05-10 MED ORDER — BUDESONIDE-FORMOTEROL FUMARATE 160-4.5 MCG/ACT IN AERO: 2.0000 | INHALATION_SPRAY | Freq: Two times a day (BID) | RESPIRATORY_TRACT | 2 refills | Status: DC

## 2016-05-10 NOTE — Patient Instructions (Addendum)
   You are probably having an allergy triggered asthma flare with upper respiratory symptoms.  I don't see signs of infection today  Keep appointment with the allergist.  Take prednisone 3-5 days and add on Symbicort inhaler as a controller inhaler until you seer allergist.   Also begin Flonase 2 sprays each side of the nose once a day to decrease allergy nose symptoms.  No tobacco exposure   Would have your  New allergist take over medication as needed to control allergy asthma

## 2016-10-07 ENCOUNTER — Encounter: Payer: Self-pay | Admitting: Internal Medicine

## 2016-12-19 ENCOUNTER — Telehealth: Payer: Self-pay | Admitting: Family Medicine

## 2016-12-19 NOTE — Telephone Encounter (Signed)
  I don't have     Recommendations  for   PCP in that area but agree that she needs to get one.   Sorry she is feeling badly   But no other advice   Hydration and time If her asthma flares up then may need  More evaluation.

## 2016-12-19 NOTE — Telephone Encounter (Signed)
LM for patient to return call.

## 2016-12-19 NOTE — Telephone Encounter (Signed)
Copied from CRM [phone]. Topic: Quick Communication - See Telephone Encounter >> Dec 19, 2016  9:50 AM Jillian Copeland, Jillian C wrote: CRM for notification. See Telephone encounter for: pt would like to know if provider could suggest a PCP in [place] for her. Pt says that she has lived in [place] for 2 years and usually drives back to see provider. Pt says that she's not feeling well today but cant drive to [place] today.    Please advise.   CB: [phone]: [phone]  12/19/16.

## 2016-12-19 NOTE — Telephone Encounter (Signed)
Pt is requesting a Primary Care MD in [place]  Pt states that she wants to transfer care to somewhere close to home  Any recommendations? ------- Pt states that she was seen by UC today  Tested for flu and it was negative.  Pt was given Promethazine cough syrup and Ipratropium nasal spray. Pt is supposed to start these today.  Sore throat, cough x 5 days Pt also having diarrhea, N/V x 1 days.  Denies fever.   Pt wanting to know if there are any further recommendations that can be given for symptom relief.   Please advise Dr Fabian SharpPanosh, thanks.

## 2016-12-19 NOTE — Telephone Encounter (Signed)
Pt aware of rec's per Dr Panosh.  Nothing further needed.  

## 2017-02-03 NOTE — Progress Notes (Signed)
Chief Complaint  Patient presents with  . Back Pain    Lower back pain - Pt states pain has worsened and is now causing her legs to go numb/tingly. Pt states that the numbness/tingly feeling will come out of nowhere -- not positional. Pt has missed work d/t worsening pain.    HPI: Jillian Copeland 32 y.o.  sda  About back pain  Never really went a way    Totally.  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   Feels like    hitting funny bones.  And  Hard to stand  rsted off last weekend   No bowel or bladdre incontinence   Sen 2016 ed fora cute back pain and  Again 2017 for  bp without radiations   Works  2 days on 2 off.  37 and 50 .   [place]     "Sleeps on a heating pad"  Pain is better today after rest for a few days    ROS: See pertinent positives and negatives per HPI. No fever now on ocps  Periods olk . No vomiting  Falling   Has obgyne  Past Medical History:  Diagnosis Date  . Abnormal finding on Pap smear, ASCUS    with neg hpv  . Asthma   . Chest pain    eval in er in July  . Cholelithiasis without obstruction 10/11/2010  . Diabetes mellitus without complication (HCC)   . PCOS (polycystic ovarian syndrome)   . Personal history of amenorrhea    oligo  . Pilonidal cyst   . Pilonidal cyst with abscess, I&D 03/24/2011. 03/24/2011    No family history on file.  Social History   Socioeconomic History  . Marital status: Single    Spouse name: None  . Number of children: None  . Years of education: None  . Highest education level: None  Social Needs  . Financial resource strain: None  . Food insecurity - worry: None  . Food insecurity - inability: None  . Transportation needs - medical: None  . Transportation needs - non-medical: None  Occupational History  . None  Tobacco Use  . Smoking status:  Current Every Day Smoker    Packs/day: 0.25    Types: Cigarettes  . Smokeless tobacco: Never Used  . Tobacco comment: less than 10 a day   Substance and Sexual Activity  . Alcohol use: Yes    Comment: social  . Drug use: No  . Sexual activity: Yes    Birth control/protection: None  Other Topics Concern  . 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 albuterol (PROVENTIL HFA;VENTOLIN HFA) 108 (90 Base) MCG/ACT inhaler Inhale 1-2 puffs into the lungs every 6 (six) hours as needed for wheezing or shortness of breath.    . budesonide-formoterol (SYMBICORT) 160-4.5 MCG/ACT inhaler Inhale 2 puffs into the lungs 2 (two) times daily. 1 Inhaler 2  . fluticasone (FLONASE) 50 MCG/ACT nasal spray Place 2 sprays into both nostrils daily.  16 g 3  . predniSONE (DELTASONE) 20 MG tablet Take 1 tablet (20 mg total) by mouth 2 (two) times daily with a meal. (Patient not taking: Reported on 02/06/2017) 10 tablet 0   No facility-administered medications prior to visit.      EXAM:  BP (!) 138/94 (BP Location: Left Arm, Patient Position: Sitting, Cuff Size: Normal)   Pulse 82   Temp 98.5 F (36.9 C) (Oral)   Wt 207 lb 9.6 oz (94.2 kg)   BMI 35.63 kg/m   Body mass index is 35.63 kg/m.  GENERAL: vitals reviewed and listed above, alert, oriented, appears well hydrated and in no acute distress HEENT: atraumatic, conjunctiva  clear, no obvious abnormalities on inspection of external nose and ears NECK: no obvious masses on inspection palpation  CV: HRRR, no clubbing cyanosis or  peripheral edema nl cap refill  bak  Tender  Mid ls area   Toe heel is   Ok neg slr  Hard to eleicit  Patellar reponse   MS: moves all extremities without noticeable focal  abnormality PSYCH: pleasant and cooperative, no obvious depression or anxiety   ASSESSMENT AND  PLAN:  Discussed the following assessment and plan:  Low back pain with radiation - over a few years with acute  flars with radiation and ? weakness vs severe pain.  advise more evaluation - Plan: CBC with Differential/Platelet, Sedimentation rate, CMP, POCT urinalysis dipstick, MR Lumbar Spine Wo Contrast Lives out of town  Days off jan 24 25, 29 30th februrary 7,8,21,22,26,27  Plan  Mri spine and then referral  Ns or spine ortho .   Progressing over the past 2 years and now  Episodes of bilateral radiation to below knees  and ? Weakness vs  Pain   Fortunately exam  Non alarming today     Job entails standing long periods .     -Patient advised to return or notify health care team  if symptoms worsen ,persist or new concerns arise.  Patient Instructions  Exam is ok today but   I think we need  Further evaluation    Since  ongoing and now worse.    And radiation    Plan MRI of the back  And  referal to  Spine specialist   Ortho or NS.    You will be contacted  About the  Scan and  Referral  .    .        Neta MendsWanda K. Panosh M.D.

## 2017-02-06 ENCOUNTER — Ambulatory Visit: Payer: 59 | Admitting: Internal Medicine

## 2017-02-06 ENCOUNTER — Encounter: Payer: Self-pay | Admitting: Internal Medicine

## 2017-02-06 VITALS — BP 138/94 | HR 82 | Temp 98.5°F | Wt 207.6 lb

## 2017-02-06 DIAGNOSIS — M5440 Lumbago with sciatica, unspecified side: Secondary | ICD-10-CM | POA: Diagnosis not present

## 2017-02-06 DIAGNOSIS — M545 Low back pain, unspecified: Secondary | ICD-10-CM

## 2017-02-06 LAB — COMPREHENSIVE METABOLIC PANEL
ALK PHOS: 79 U/L (ref 39–117)
ALT: 14 U/L (ref 0–35)
AST: 16 U/L (ref 0–37)
Albumin: 4.2 g/dL (ref 3.5–5.2)
BUN: 10 mg/dL (ref 6–23)
CALCIUM: 9.6 mg/dL (ref 8.4–10.5)
CO2: 27 mEq/L (ref 19–32)
Chloride: 103 mEq/L (ref 96–112)
Creatinine, Ser: 0.82 mg/dL (ref 0.40–1.20)
GFR: 104.61 mL/min (ref >60.00)
GLUCOSE: 94 mg/dL (ref 70–99)
Potassium: 4.5 mEq/L (ref 3.5–5.1)
Sodium: 137 mEq/L (ref 135–145)
TOTAL PROTEIN: 6.7 g/dL (ref 6.0–8.3)
Total Bilirubin: 0.3 mg/dL (ref 0.2–1.2)

## 2017-02-06 LAB — POCT URINALYSIS DIP (MANUAL ENTRY)
BILIRUBIN UA: NEGATIVE
Glucose, UA: NEGATIVE mg/dL
Ketones, POC UA: NEGATIVE mg/dL
Leukocytes, UA: NEGATIVE
NITRITE UA: NEGATIVE
Protein Ur, POC: NEGATIVE mg/dL
Spec Grav, UA: 1.025 (ref 1.010–1.025)
Urobilinogen, UA: 0.2 E.U./dL
pH, UA: 6.5 (ref 5.0–8.0)

## 2017-02-06 LAB — CBC WITH DIFFERENTIAL/PLATELET
Basophils Absolute: 0.1 10*3/uL (ref 0.0–0.1)
Basophils Relative: 0.6 % (ref 0.0–3.0)
EOS PCT: 2.7 % (ref 0.0–5.0)
Eosinophils Absolute: 0.2 10*3/uL (ref 0.0–0.7)
HCT: 41.1 % (ref 36.0–46.0)
HEMOGLOBIN: 13.6 g/dL (ref 12.0–15.0)
Lymphocytes Relative: 43.3 % (ref 12.0–46.0)
Lymphs Abs: 4 10*3/uL (ref 0.7–4.0)
MCHC: 33.1 g/dL (ref 30.0–36.0)
MCV: 89 fl (ref 78.0–100.0)
MONOS PCT: 5.8 % (ref 3.0–12.0)
Monocytes Absolute: 0.5 10*3/uL (ref 0.1–1.0)
Neutro Abs: 4.4 10*3/uL (ref 1.4–7.7)
Neutrophils Relative %: 47.6 % (ref 43.0–77.0)
Platelets: 496 10*3/uL — ABNORMAL HIGH (ref 150.0–400.0)
RBC: 4.62 Mil/uL (ref 3.87–5.11)
RDW: 13.3 % (ref 11.5–15.5)
WBC: 9.2 10*3/uL (ref 4.0–10.5)

## 2017-02-06 LAB — SEDIMENTATION RATE: Sed Rate: 24 mm/hr — ABNORMAL HIGH (ref 0–20)

## 2017-02-06 NOTE — Patient Instructions (Addendum)
Exam is ok today but   I think we need  Further evaluation    Since  ongoing and now worse.    And radiation    Plan MRI of the back  And  referal to  Spine specialist   Ortho or NS.    You will be contacted  About the  Scan and  Referral  .    .

## 2017-10-29 IMAGING — CR DG CHEST 2V
2 series · 2 of 2 positions shown · non-contrast
Comparison: Prior chest x-ray 01/13/2012

CLINICAL DATA: 29-year-old female with lightheadedness and
dizziness

EXAM:
CHEST  2 VIEW

[w chest pa]
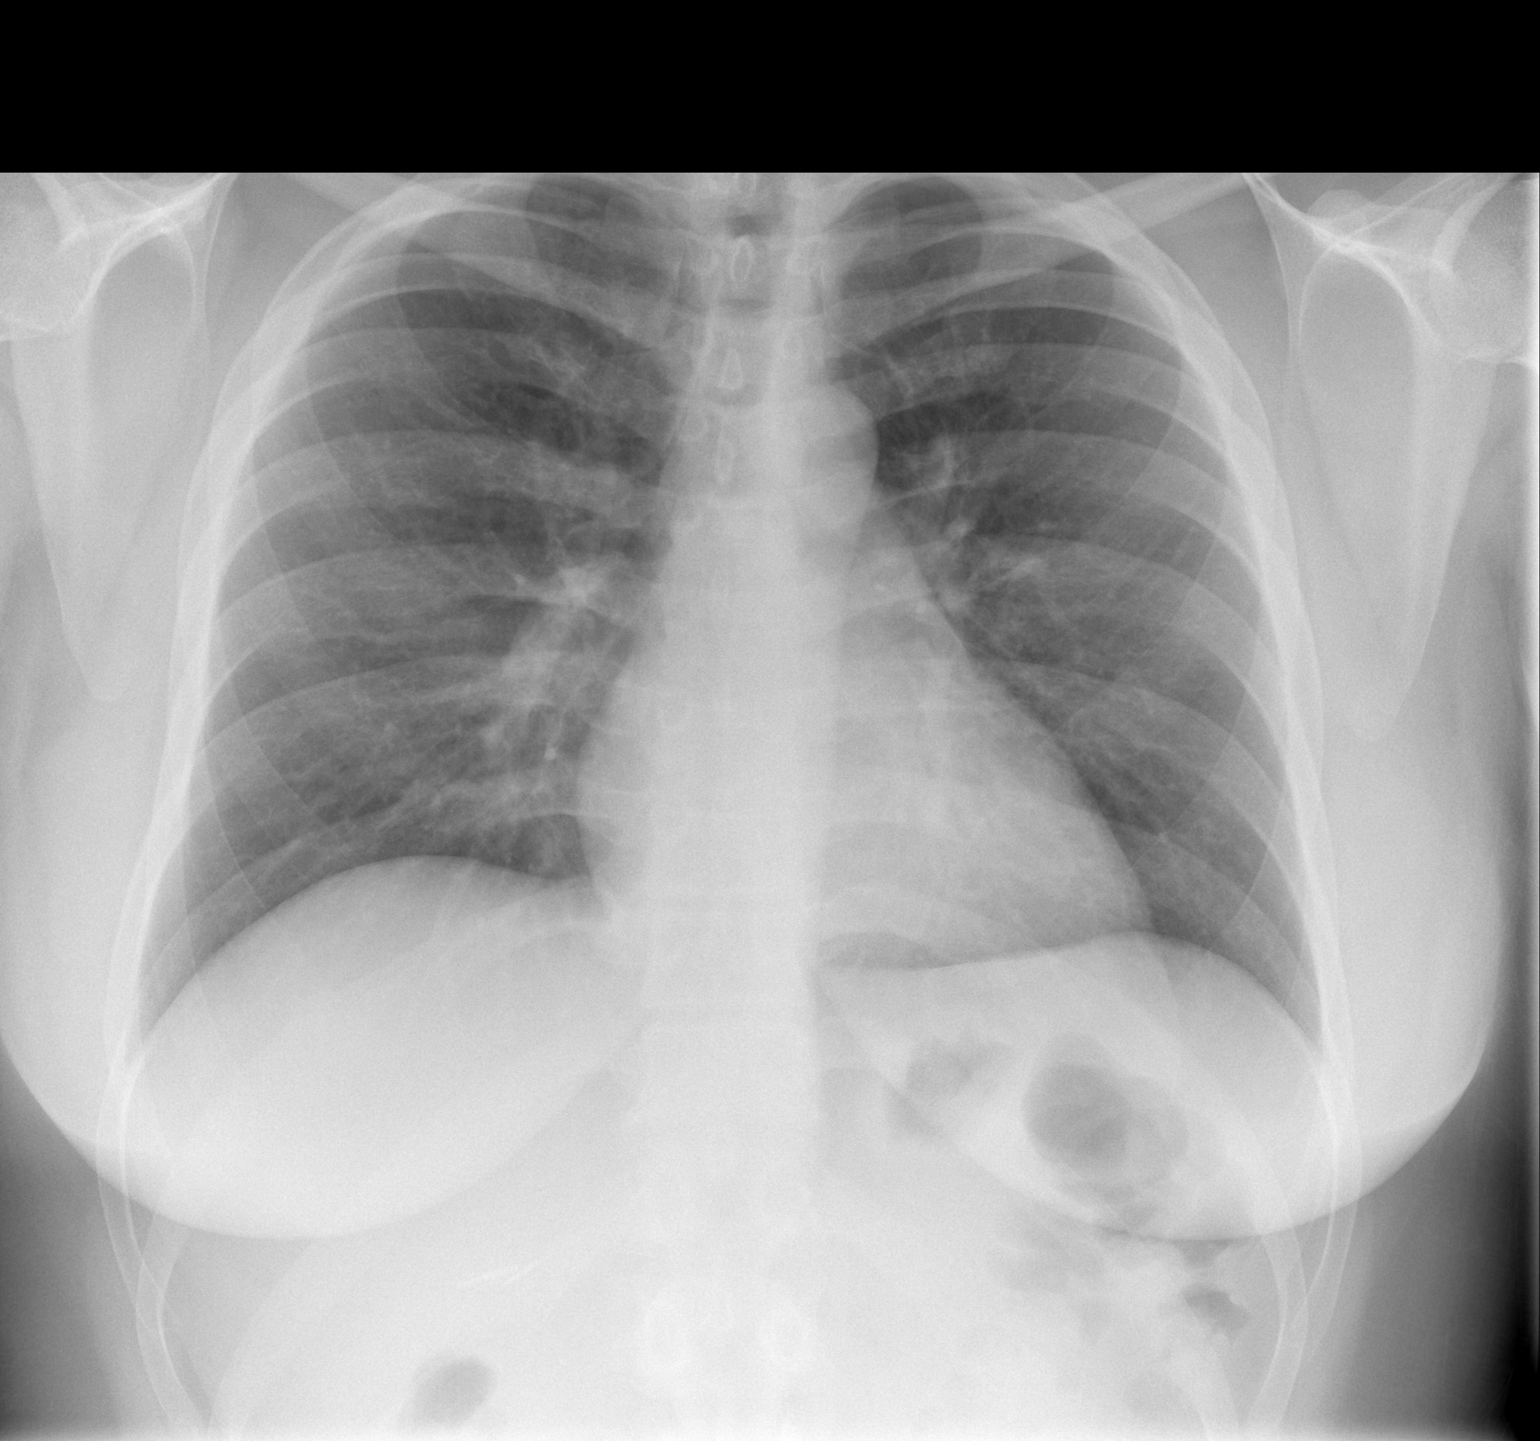

[w chest lat]
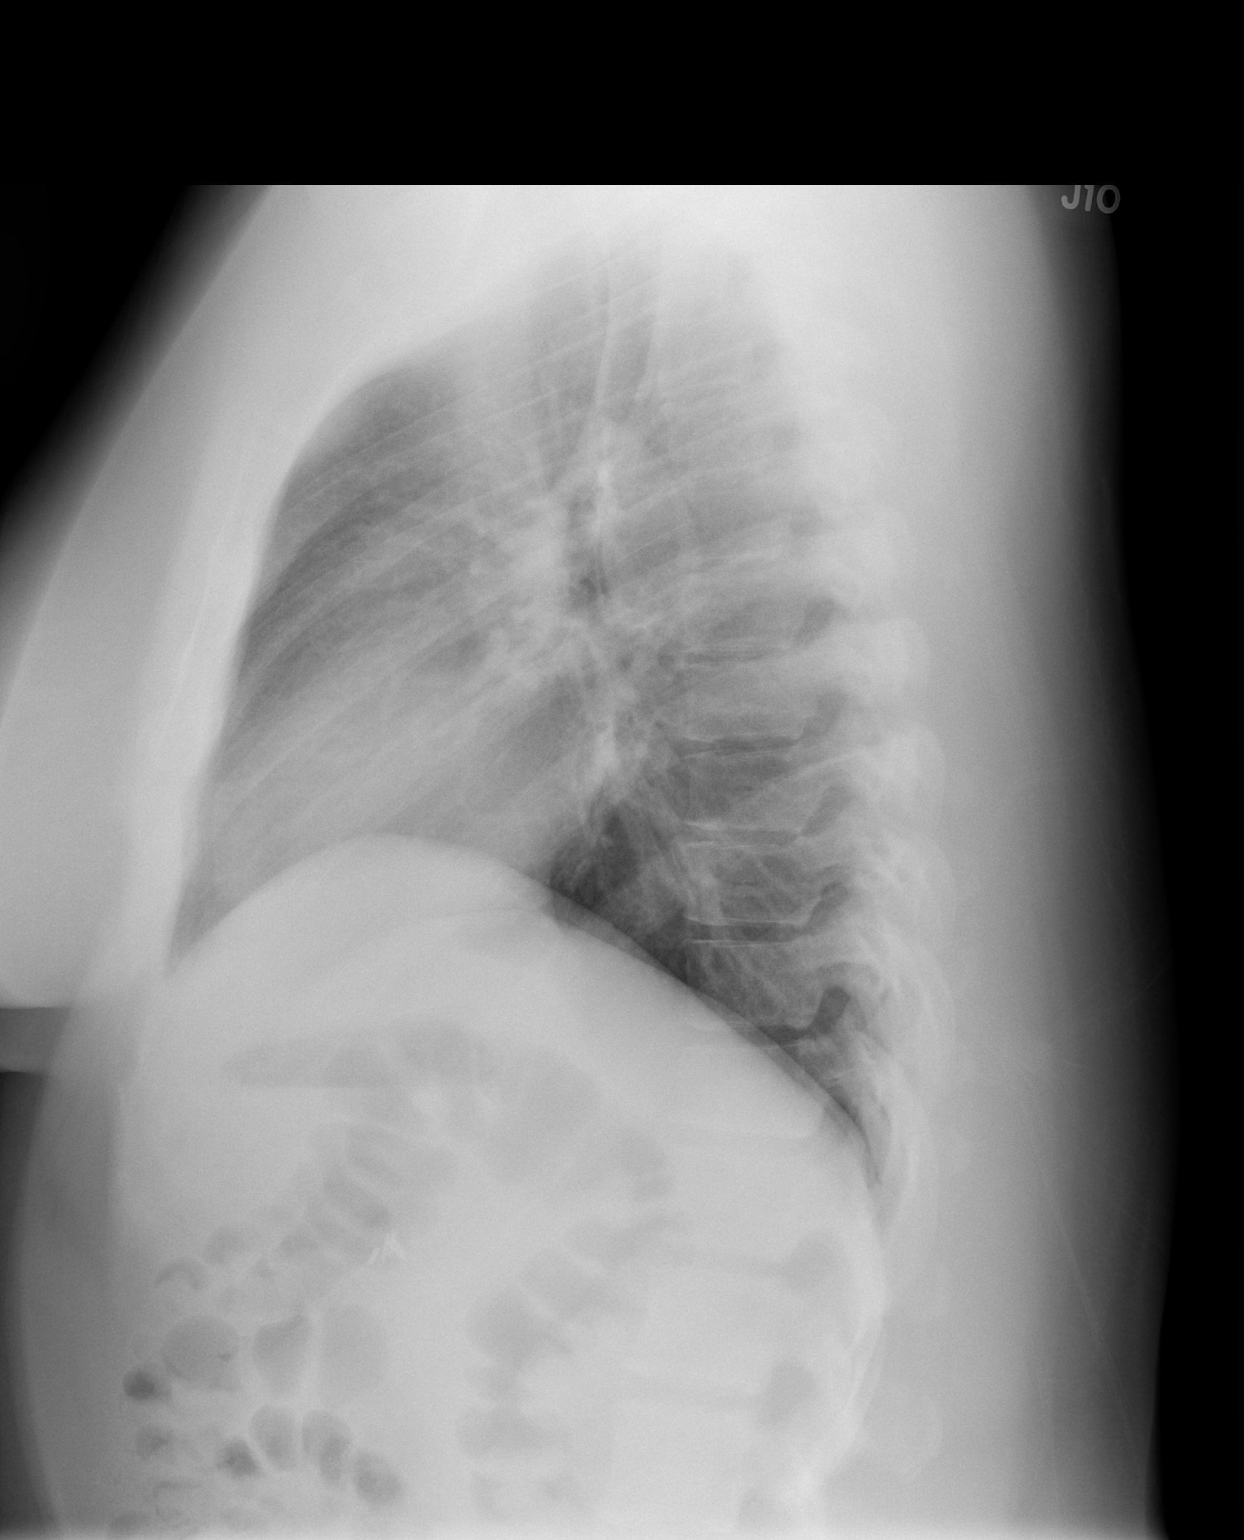

[2 of 2 positions shown; findings below may reference images not displayed]

FINDINGS: The lungs are clear and negative for focal airspace consolidation,
pulmonary edema or suspicious pulmonary nodule. No pleural effusion
or pneumothorax. Cardiac and mediastinal contours are within normal
limits. No acute fracture or lytic or blastic osseous lesions. The
visualized upper abdominal bowel gas pattern is unremarkable.
IMPRESSION: Normal chest x-ray.

## 2018-12-24 ENCOUNTER — Other Ambulatory Visit: Payer: Self-pay

## 2018-12-24 DIAGNOSIS — Z20822 Contact with and (suspected) exposure to covid-19: Secondary | ICD-10-CM

## 2018-12-25 LAB — NOVEL CORONAVIRUS, NAA: SARS-CoV-2, NAA: NOT DETECTED

## 2020-01-30 ENCOUNTER — Telehealth (INDEPENDENT_AMBULATORY_CARE_PROVIDER_SITE_OTHER): Payer: 59 | Admitting: Family Medicine

## 2020-01-30 ENCOUNTER — Encounter: Payer: Self-pay | Admitting: Family Medicine

## 2020-01-30 VITALS — Wt 213.0 lb

## 2020-01-30 DIAGNOSIS — J4521 Mild intermittent asthma with (acute) exacerbation: Secondary | ICD-10-CM

## 2020-01-30 MED ORDER — BUDESONIDE-FORMOTEROL FUMARATE 160-4.5 MCG/ACT IN AERO: 2.0000 | INHALATION_SPRAY | Freq: Two times a day (BID) | RESPIRATORY_TRACT | 0 refills | Status: DC

## 2020-01-30 MED ORDER — ALBUTEROL SULFATE HFA 108 (90 BASE) MCG/ACT IN AERS: 1.0000 | INHALATION_SPRAY | Freq: Four times a day (QID) | RESPIRATORY_TRACT | 0 refills | Status: DC | PRN

## 2020-01-30 NOTE — Progress Notes (Signed)
Rx called in to pharmacy due to escribe failing

## 2020-01-30 NOTE — Progress Notes (Signed)
Rx called in due to problem with escribe

## 2020-01-30 NOTE — Progress Notes (Signed)
Virtual Visit via Telephone Note Unable to connect with patient via video visit. I connected with Jillian Copeland on 01/30/20 at  1:00 PM EST by telephone and verified that I am speaking with the correct person using two identifiers.   I discussed the limitations, risks, security and privacy concerns of performing an evaluation and management service by telephone and the availability of in person appointments. I also discussed with the patient that there may be a patient responsible charge related to this service. The patient expressed understanding and agreed to proceed.  Location patient: home Location provider: work or home office Participants present for the call: patient, provider Patient did not have a visit in the prior 7 days to address this/these issue(s).   History of Present Illness: Pt is a 35 yo female with pmh sig for asthma, seasonal allergies, PCOS, DM, alopecia, who is followed by Dr. Fabian Sharp and seen for acute concern.  Pt endorses asthma exacerbation.  Started wheezing a few days ago.  Occasional cough and chest tightness.  Endorses seasonal allergies but not taking any medications.  Has not tried anything for her symptoms as does not have any inhalers, previously on a "red one and a blue one".  States typically gets prednisone for flare.  Denies fever, chills, headache, sore throat, nausea, vomiting, rhinorrhea, sick contacts.   Observations/Objective: Patient sounds cheerful and well on the phone. I do not appreciate any SOB. Speech and thought processing are grossly intact. Patient reported vitals:  Assessment and Plan: Mild intermittent asthma with exacerbation  -Discussed starting allergy medication as needed -We will send in prescription for albuterol and Symbicort inhaler - Plan: albuterol (VENTOLIN HFA) 108 (90 Base) MCG/ACT inhaler, budesonide-formoterol (SYMBICORT) 160-4.5 MCG/ACT inhaler -Given precautions -Follow-up with PCP for worsening or continued  symptoms.  Follow Up Instructions: F/u with pcp   [id number] 5-10 [id number] 11-20 9443 21-30 I did not refer this patient for an OV in the next 24 hours for this/these issue(s).  I discussed the assessment and treatment plan with the patient. The patient was provided an opportunity to ask questions and all were answered. The patient agreed with the plan and demonstrated an understanding of the instructions.   The patient was advised to call back or seek an in-person evaluation if the symptoms worsen or if the condition fails to improve as anticipated.  I provided 7 minutes of non-face-to-face time during this encounter.   Deeann Saint, MD

## 2020-01-31 ENCOUNTER — Other Ambulatory Visit: Payer: Self-pay

## 2020-01-31 ENCOUNTER — Telehealth: Payer: Self-pay | Admitting: Internal Medicine

## 2020-01-31 MED ORDER — QVAR REDIHALER 40 MCG/ACT IN AERB: 1.0000 | INHALATION_SPRAY | Freq: Two times a day (BID) | RESPIRATORY_TRACT | 0 refills | Status: DC

## 2020-01-31 NOTE — Telephone Encounter (Signed)
Patient is calling and stated that she was seen by Dr. Salomon Fick and one of the inhalers that has steroids in was not covered by insurance. Pt wanted to see if she can get the steroid medication in pills, please advise. CB is [phone]

## 2020-01-31 NOTE — Telephone Encounter (Signed)
Please advise 

## 2020-02-12 NOTE — Telephone Encounter (Signed)
Late entry.  Call returned to pt regarding this matter.

## 2020-02-12 NOTE — Telephone Encounter (Signed)
error 

## 2020-12-16 ENCOUNTER — Encounter: Payer: Self-pay | Admitting: Internal Medicine

## 2020-12-16 ENCOUNTER — Telehealth (INDEPENDENT_AMBULATORY_CARE_PROVIDER_SITE_OTHER): Payer: BC Managed Care – PPO | Admitting: Internal Medicine

## 2020-12-16 VITALS — Temp 103.0°F | Ht 64.0 in | Wt 213.0 lb

## 2020-12-16 DIAGNOSIS — J4521 Mild intermittent asthma with (acute) exacerbation: Secondary | ICD-10-CM

## 2020-12-16 DIAGNOSIS — R6889 Other general symptoms and signs: Secondary | ICD-10-CM

## 2020-12-16 MED ORDER — QVAR REDIHALER 40 MCG/ACT IN AERB: 1.0000 | INHALATION_SPRAY | Freq: Two times a day (BID) | RESPIRATORY_TRACT | 3 refills | Status: DC

## 2020-12-16 MED ORDER — ALBUTEROL SULFATE HFA 108 (90 BASE) MCG/ACT IN AERS: 1.0000 | INHALATION_SPRAY | Freq: Four times a day (QID) | RESPIRATORY_TRACT | 2 refills | Status: DC | PRN

## 2020-12-16 MED ORDER — OSELTAMIVIR PHOSPHATE 75 MG PO CAPS: 75.0000 mg | ORAL_CAPSULE | Freq: Two times a day (BID) | ORAL | 0 refills | Status: DC

## 2020-12-16 NOTE — Progress Notes (Signed)
Virtual Visit via Video Note  I connected withNAME@ on 12/16/20 at  3:00 PM EST by a video enabled telemedicine application and verified that I am speaking with the correct person using two identifiers. 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  Patient aware  of the limitations of evaluation and management by telemedicine and  availability of in person appointments. and agreed to proceed.   HPI: Jillian Copeland presents for video visit sending with 24 hours of symptoms see above this morning had a fever of 103 but never had chills fever is now down after medication she does have a cough thinks that her wheezing may be flaring. She has had COVID  immunizations but not the infection and has not had the flu shot this year has not had it for a while.  Has not tested for COVID yet. Is running out of her refills for Qvar and albuterol she states the steroid inhaler has been quite helpful and she uses when she gets a cold that controls her asthma.  ROS: See pertinent positives and negatives per HPI.  Past Medical History:  Diagnosis Date   Abnormal finding on Pap smear, ASCUS    with neg hpv   Asthma    Chest pain    eval in er in July   Cholelithiasis without obstruction 10/11/2010   Diabetes mellitus without complication (HCC)    PCOS (polycystic ovarian syndrome)    Personal history of amenorrhea    oligo   Pilonidal cyst    Pilonidal cyst with abscess, I&D 03/24/2011. 03/24/2011    Past Surgical History:  Procedure Laterality Date   CHOLECYSTECTOMY  2012   PILONIDAL CYST DRAINAGE  2013    History reviewed. No pertinent family history.  Social History   Tobacco Use   Smoking status: Every Day    Packs/day: 0.25    Types: Cigarettes   Smokeless tobacco:  Never   Tobacco comments:    less than 10 a day   Substance Use Topics   Alcohol use: Yes    Comment: social   Drug use: No      Current Outpatient Medications:    fluticasone (FLONASE) 50 MCG/ACT nasal spray, Place 2 sprays into both nostrils daily., Disp: 16 g, Rfl: 3   oseltamivir (TAMIFLU) 75 MG capsule, Take 1 capsule (75 mg total) by mouth 2 (two) times daily. For 5 days, Disp: 10 capsule, Rfl: 0   albuterol (VENTOLIN HFA) 108 (90 Base) MCG/ACT inhaler, Inhale 1-2 puffs into the lungs every 6 (six) hours as needed for wheezing or shortness of breath., Disp: 1 each, Rfl: 2   beclomethasone (QVAR REDIHALER) 40 MCG/ACT inhaler, Inhale 1 puff into the lungs 2 (two) times daily., Disp: 1 each, Rfl: 3  EXAM: BP Readings from Last 3 Encounters:  02/06/17 (!) 138/94  05/10/16 130/90  07/09/15 (!) 136/96    VITALS per patient if applicable:  GENERAL: alert, oriented, appears well and in no acute distress nontoxic some congestion no respiratory distress  HEENT: atraumatic, conjunttiva clear, no obvious abnormalities on inspection of external nose and ears  NECK: normal movements of the head and neck  LUNGS: on inspection no signs of respiratory distress, breathing rate appears normal, no obvious gross SOB, gasping or wheezing  CV: no obvious cyanosis  MS: moves  all visible extremities without noticeable abnormality  PSYCH/NEURO: pleasant and cooperative, no obvious depression or anxiety, speech and thought processing grossly intact Lab Results  Component Value Date   WBC 9.2 02/06/2017   HGB 13.6 02/06/2017   HCT 41.1 02/06/2017   PLT 496.0 (H) 02/06/2017   GLUCOSE 94 02/06/2017   CHOL 184 04/10/2007   TRIG 39 04/10/2007   HDL 59.3 04/10/2007   LDLCALC 117 (H) 04/10/2007   ALT 14 02/06/2017   AST 16 02/06/2017   NA 137 02/06/2017   K 4.5 02/06/2017   CL 103 02/06/2017   CREATININE 0.82 02/06/2017   BUN 10 02/06/2017   CO2 27 02/06/2017   TSH 0.48 04/10/2007     ASSESSMENT AND PLAN:  Discussed the following assessment and plan:    ICD-10-CM   1. Flu-like symptoms  R68.89     2. Mild intermittent asthma with exacerbation  J45.21 albuterol (VENTOLIN HFA) 108 (90 Base) MCG/ACT inhaler    So not a lot of myalgias however her fever being high 103 at onset and we are in the middle of the flu season currently Tamiflu is offered risk-benefit. Her risk factor is asthma. Refill her inhalers reviewed alarm symptoms of when to seek extra care. Would also have her do a home COVID test during this illness.  In that case Tamiflu would not be helpful for positive.    Risk-benefit assessed Note for work to be out for 24 hours after her fever resolves and feeling better will be at least 2 to 3 days if not more however if she has a positive COVID test should be out for 10 days from the onset of symptoms. Counseled.   Expectant management and discussion of plan and treatment with opportunity to ask questions and all were answered. The patient agreed with the plan and demonstrated an understanding of the instructions. 30 minutes record review counsel visit plan Advised to call back or seek an in-person evaluation if worsening  or having  further concerns  in interim. Return if symptoms worsen or fail to improve as expected.    Berniece Andreas, MD

## 2021-03-23 ENCOUNTER — Telehealth: Payer: Self-pay | Admitting: Internal Medicine

## 2021-03-23 NOTE — Telephone Encounter (Signed)
Spoke with patient and informed of message and patient stated she is in [place] and would prefer a virtual appt. Pt scheduled on 3/9 8:30  ?

## 2021-03-23 NOTE — Telephone Encounter (Signed)
Needs to schedule a visit for further advice, we could do it virtual if she has a good camera.  Obviously preferred in person to do a better exam.

## 2021-03-23 NOTE — Telephone Encounter (Signed)
Patient is requesting a recommendation from Dr.Panosh to see a specialist because her bone in her hand is sticking out. Paient stated that it is painful at times and she knows that it should be that way.  ? ?Should patient schedule an OV or are you able to do this because patient lives in [place]? ? ?Patient could be contacted at [phone]. ? ?Please advise. ?

## 2021-03-23 NOTE — Telephone Encounter (Signed)
Last Ov 02/06/2017 ?Please advise ?

## 2021-03-25 ENCOUNTER — Telehealth: Payer: Self-pay | Admitting: Internal Medicine

## 2021-03-25 ENCOUNTER — Telehealth (INDEPENDENT_AMBULATORY_CARE_PROVIDER_SITE_OTHER): Payer: BC Managed Care – PPO | Admitting: Internal Medicine

## 2021-03-25 ENCOUNTER — Encounter: Payer: Self-pay | Admitting: Internal Medicine

## 2021-03-25 DIAGNOSIS — R2232 Localized swelling, mass and lump, left upper limb: Secondary | ICD-10-CM

## 2021-03-25 NOTE — Telephone Encounter (Signed)
Pt call and stated she want a referral to go to dr. Bradly Bienenstock at [facility]. ?

## 2021-03-25 NOTE — Telephone Encounter (Signed)
I ordered the hand referral  this am :  please check to make sure ok  ? Dont think you need to send again if  order looks ok   ?thanks

## 2021-03-25 NOTE — Telephone Encounter (Signed)
Vv this morning is it ok to send referral? ?Please advise ?

## 2021-03-25 NOTE — Progress Notes (Signed)
?Virtual Visit via Video Note ? ?I connected with Jillian Copeland on 03/25/21 at  8:30 AM EST by a video enabled telemedicine application and verified that I am speaking with the correct person using two identifiers. ?Location patient: home she is now living in [place] for the last years  ?Location provider:home office ?Persons participating in the virtual visit: patient, provider ? ?[place] national recommendations  regarding COVID 19 pandemic   video visit is advised over in office visit for this patient.  ?Patient aware  of the limitations of evaluation and management by telemedicine and  availability of in person appointments. and agreed to proceed. ? ? ?HPI: ?Jillian Copeland presents for video visit because of ongoing swelling lump near her left wrist. ?She reports that she has had some swelling there for quite a while but more recently has enlarged and is occasionally tender. ?Denies any fall injury numbness decreased grip strength in that left hand. 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 ?Believes she was seen for this in the past to use a topical cream by's specialist but not a hand person. ?She states it is size of a quarter or by states it feels more like a bone in the cyst but she is not sure. ? ? ?ROS: See pertinent positives and negatives per HPI. ? ?Past Medical History:  ?Diagnosis Date  ? Abnormal finding on Pap smear, ASCUS   ? with neg hpv  ? Asthma   ? Chest pain   ? eval in er in July  ? Cholelithiasis without obstruction 10/11/2010  ? Diabetes mellitus without complication (HCC)   ? PCOS (polycystic ovarian syndrome)   ? Personal history of amenorrhea   ? oligo  ? Pilonidal cyst   ? Pilonidal cyst with abscess, I&D 03/24/2011. 03/24/2011  ? ? ?Past Surgical History:  ?Procedure Laterality Date  ? CHOLECYSTECTOMY  2012  ? PILONIDAL CYST DRAINAGE  2013  ? ? ?No family history on file. ? ?Social History  ? ?Tobacco Use  ? Smoking status: Every  Day  ?  Packs/day: 0.25  ?  Types: Cigarettes  ? Smokeless tobacco: Never  ? Tobacco comments:  ?  less than 10 a day   ?Substance Use Topics  ? Alcohol use: Yes  ?  Comment: social  ? Drug use: No  ? ? ? ? ? ?EXAM: ?BP Readings from Last 3 Encounters:  ?02/06/17 (!) 138/94  ?05/10/16 130/90  ?07/09/15 (!) 136/96  ? ? ?VITALS per patient if applicable: ? ?GENERAL: alert, oriented, appears well and in no acute distress ? ?HEENT: atraumatic, conjunttiva clear, no obvious abnormalities on inspection of external nose and ears ? ?NECK: normal movements of the head and neck ? ?LUNGS: on inspection no signs of respiratory distress, breathing rate appears normal, no obvious gross SOB, gasping or wheezing ? ?CV: no obvious cyanosis ? ?MS: moves all visible extremities left hand  with large round  lump radial volar surface  at wrist  no redness   cystic looking . Rom  of hand srist nl and no obvious atrophy ? ?PSYCH/NEURO: pleasant and cooperative, no obvious depression or anxiety, speech and thought processing grossly intact ? ? ?ASSESSMENT AND PLAN: ? ?Discussed the following assessment and plan: ? ?  ICD-10-CM   ?1. Wrist lump, left prob ganglion cyst   R22.32   ?  ? ?Disc probable  dx  ganglion based on visual  ?Advise evaluation  may just plan  observation but because of concern and size  advise opinion from hand surgeon.  Ortho  ?She states she can come back to [facility] for appt referral  ?Counseled.  ? Expectant management and discussion of plan and treatment with opportunity to ask questions and all were answered. The patient agreed with the plan and demonstrated an understanding of the instructions. ?  ?Advised to call back or seek an in-person evaluation if worsening  or having  further concerns  in interim. ?Return for as indicated or if worsening in interim . ? ? ? ?Berniece Andreas, MD  ?

## 2021-10-12 ENCOUNTER — Telehealth: Payer: Self-pay | Admitting: Internal Medicine

## 2021-10-12 NOTE — Telephone Encounter (Signed)
Pt called requesting a referral to see the dermatologist.   Pt wants to do a virtual because she states she lives way too far to come in for an OV.   Pt has not been here since 02/06/2017.  Pt states she only does virtual visits with MD.  Please advise.

## 2021-10-13 NOTE — Telephone Encounter (Signed)
Call patient. Patient busy with another call. Inform her that I can give her a call later on.

## 2021-10-27 NOTE — Progress Notes (Signed)
   Virtual Visit via Telephone Note  I connected with Jillian Copeland  on 10/28/21 at  8:30 AM EDT by telephone and verified that I am speaking with the correct person using two identifiers.   I discussed the limitations, risks, security and privacy concerns of performing an evaluation and management service by telephone and the limited availability of in person appointments. tThere may be a patient responsible charge related to this service. The patient expressed understanding and agreed to proceed.  Location patient: home Location provider: home office Participants present for the call: patient, provider Patient did not have a visit in the prior 7 days to address this/these issue(s).   History of Present Illness: Jillian Copeland  presents and needs help with  a number of problems  She has now moved to [place] in a new home . Has been battling  recurrent boils in [place] " private part area) for a number or years and last year  saw a dermaologist who rx doxycycline?  And advised 90 days   she said had side effects in female parts so didn't take the prolonged course .    The boils are flaring up again .  No fever . Requests consult referral to a different dermatologist  for help  She also says has what could be considered adult acne and never had this problem.   Sees Gyne Dr Benjie Karvonen  in [place]   rx her for pcos    Seen in [place] last month for stuffy nose and  nose bleed ( not recurrent at this time) but has allergy hx no tobacco now and pet  dog. Feels poss related to new home  new construction.  Also noted in ehr bp has been elevated and was placed on meds via [facility]  and ? Spironolactone per gyne?      Observations/Objective: Patient sounds personable and well on the phone. I do not appreciate any SOB. Speech and thought processing are grossly intact. Record review   care every where  PE NA  Assessment and Plan: Hidradenitis suppurativa  suspected w recurrent boils - recurrent boils   GU area . under rx for fertility and pcos per gyne.  Stuffy nose - hx allergy   inc saline  can try sensitive nasal steroid with caution to avoid nose bleed ( stop if occures)   PCOS (polycystic ovarian syndrome) - care  per gyne  Also disc  perhaps looking for local pcp but she works in [employer].  Follow Up Instructions: I agree with derm referral send [place] the information for referral.  Newer meds plans  fr HS and or pcos skin changes  Send info  of derm local. Send  monitor bp readings and need fu   [id number] 5-10 99442 11-20 [id number] 21-30 I did not refer this patient for an OV in the next 24 hours for this/these issue(s).  I discussed the assessment and treatment plan with the patient. The patient was provided an opportunity to ask questions and answered. The patient agreed with the plan and demonstrated an understanding of the instructions.   The patient was advised to call back or seek an in-person evaluation if the symptoms worsen or if the condition fails to improve as anticipated.  I provided 18 minutes of non-face-to-face time during this encounter. Return for bp readings and care .  Shanon Ace, MD

## 2021-10-28 ENCOUNTER — Encounter: Payer: Self-pay | Admitting: Internal Medicine

## 2021-10-28 ENCOUNTER — Telehealth (INDEPENDENT_AMBULATORY_CARE_PROVIDER_SITE_OTHER): Payer: BC Managed Care – PPO | Admitting: Internal Medicine

## 2021-10-28 VITALS — BP 148/89 | HR 95 | Ht 64.0 in | Wt 200.0 lb

## 2021-10-28 DIAGNOSIS — E282 Polycystic ovarian syndrome: Secondary | ICD-10-CM

## 2021-10-28 DIAGNOSIS — R0981 Nasal congestion: Secondary | ICD-10-CM | POA: Diagnosis not present

## 2021-10-28 DIAGNOSIS — L732 Hidradenitis suppurativa: Secondary | ICD-10-CM

## 2021-10-29 ENCOUNTER — Other Ambulatory Visit: Payer: Self-pay

## 2021-10-29 DIAGNOSIS — L732 Hidradenitis suppurativa: Secondary | ICD-10-CM

## 2021-10-29 NOTE — Telephone Encounter (Signed)
Spoke to patient. Pt was seen with Dr. Regis Bill on Thursday.

## 2021-11-02 NOTE — Telephone Encounter (Signed)
LVM informing pt the referral has been sent & received & she should be expecting a call from the referring office.

## 2022-01-15 ENCOUNTER — Telehealth: Payer: BC Managed Care – PPO | Admitting: Nurse Practitioner

## 2022-01-15 ENCOUNTER — Telehealth: Payer: BC Managed Care – PPO

## 2022-04-11 ENCOUNTER — Other Ambulatory Visit: Payer: Self-pay | Admitting: Internal Medicine

## 2022-04-11 ENCOUNTER — Telehealth: Payer: Self-pay | Admitting: Internal Medicine

## 2022-04-11 NOTE — Telephone Encounter (Signed)
Ok to refill x 3   Then would need FU visit before refills run out .

## 2022-04-11 NOTE — Telephone Encounter (Signed)
Prescription Request  04/11/2022  LOV:  10/2021  What is the name of the medication or equipment? Qvar RediHaler beclomethasone (QVAR REDIHALER) 40 MCG/ACT inhaler  Pt stated she is going out of town and only has about 5 pumps left.  Pt was informed that MD is OOO on Mondays. Pt asked if another provider could send in this refill? Please advise.  Have you contacted your pharmacy to request a refill? Yes   Which pharmacy would you like this sent to?  [id number] - [place], [place] [address] Phone: [phone]  Fax: [phone]     Patient notified that their request is being sent to the clinical staff for review and that they should receive a response within 2 business days.   Please advise at Mobile [phone] (mobile)

## 2022-04-12 ENCOUNTER — Other Ambulatory Visit: Payer: Self-pay | Admitting: Family

## 2022-04-12 MED ORDER — QVAR REDIHALER 40 MCG/ACT IN AERB: INHALATION_SPRAY | RESPIRATORY_TRACT | 3 refills | Status: DC

## 2022-04-25 ENCOUNTER — Other Ambulatory Visit: Payer: Self-pay

## 2022-04-25 NOTE — Telephone Encounter (Signed)
Received  [facility] Drug Change Request.   Stating " drug not covered by patient plan. The preferred alternative is PULMICORTINHMCG. Please call/fax the pharmacy to change medication along with strenth, directions, quantity, and refills. "  Please advise.

## 2022-04-25 NOTE — Telephone Encounter (Signed)
Tell patient  about pharmacy denial ar other options  have her make an( virtual  ok)appt  to decide on changes denial  There are number or pulmicort options

## 2022-04-29 NOTE — Telephone Encounter (Signed)
Attempt to reach pt. Left a voicemail to call us back  

## 2022-05-02 NOTE — Telephone Encounter (Signed)
Attempted to reach pt. Left a voicemail to call us back.  

## 2022-05-02 NOTE — Telephone Encounter (Signed)
Spoke to pt.   Virtual appt is schedule tomorrow at 8:30am.

## 2022-05-03 ENCOUNTER — Encounter: Payer: Self-pay | Admitting: Internal Medicine

## 2022-05-05 NOTE — Progress Notes (Signed)
Unable to contact for video visit

## 2022-06-04 ENCOUNTER — Other Ambulatory Visit: Payer: Self-pay | Admitting: Internal Medicine

## 2022-06-04 DIAGNOSIS — J4521 Mild intermittent asthma with (acute) exacerbation: Secondary | ICD-10-CM

## 2022-06-17 ENCOUNTER — Telehealth: Payer: Self-pay

## 2022-06-17 NOTE — Telephone Encounter (Signed)
Received a fax notification from [facility] regarding to QVAR REDIHALER on 06/04/2022. Rx was sent 04/12/2022.    Stating " Drug not covered by patient plan. The preferred alternative is PULMICORTINHMCG. Please call/fax the pharmacy to change medication along with strength, directions, quantity and refills."  Contacted pharmacy to follow up. They states the rx was enclosed? Pt didn't pick it up.   Attempted to reach pt. Left voicemail to call us back.     Please advise on the medication alternative.

## 2022-07-06 NOTE — Addendum Note (Signed)
Addended byBerniece Andreas K on: 07/06/2022 02:32 PM   Modules accepted: Orders

## 2022-07-11 MED ORDER — BUDESONIDE 180 MCG/ACT IN AEPB
2.0000 | INHALATION_SPRAY | Freq: Two times a day (BID) | RESPIRATORY_TRACT | 5 refills | Status: AC
Start: 2022-07-11 — End: ?

## 2022-07-11 NOTE — Telephone Encounter (Signed)
Sent in pulmicort

## 2022-07-11 NOTE — Addendum Note (Signed)
Addended byBerniece Andreas K on: 07/11/2022 11:22 AM   Modules accepted: Orders

## 2022-07-12 NOTE — Telephone Encounter (Signed)
Attempted to reach pt. Left a voicemail to contact us back.  

## 2022-08-26 ENCOUNTER — Telehealth: Payer: Self-pay | Admitting: Internal Medicine

## 2022-08-26 NOTE — Telephone Encounter (Signed)
error
